# Patient Record
Sex: Male | Born: 1950 | Race: White | Hispanic: No | Smoking: Former smoker
Health system: Southern US, Community
[De-identification: ages and names within clinical notes are randomized; demographics above are authoritative.]

## PROBLEM LIST (undated history)

## (undated) DIAGNOSIS — K219 Gastro-esophageal reflux disease without esophagitis: Secondary | ICD-10-CM

## (undated) DIAGNOSIS — I10 Essential (primary) hypertension: Secondary | ICD-10-CM

## (undated) DIAGNOSIS — M1990 Unspecified osteoarthritis, unspecified site: Secondary | ICD-10-CM

## (undated) DIAGNOSIS — E7800 Pure hypercholesterolemia, unspecified: Secondary | ICD-10-CM

## (undated) HISTORY — DX: Pure hypercholesterolemia, unspecified: E78.00

## (undated) HISTORY — PX: JOINT REPLACEMENT: SHX530

---

## 1986-05-15 HISTORY — PX: CYSTECTOMY: SUR359

## 2001-06-25 ENCOUNTER — Encounter: Payer: Self-pay | Admitting: Internal Medicine

## 2001-06-25 ENCOUNTER — Ambulatory Visit (HOSPITAL_COMMUNITY): Admission: RE | Admit: 2001-06-25 | Discharge: 2001-06-25 | Payer: Self-pay | Admitting: Internal Medicine

## 2001-08-21 ENCOUNTER — Encounter: Payer: Self-pay | Admitting: Emergency Medicine

## 2001-08-21 ENCOUNTER — Emergency Department (HOSPITAL_COMMUNITY): Admission: EM | Admit: 2001-08-21 | Discharge: 2001-08-21 | Payer: Self-pay | Admitting: Emergency Medicine

## 2006-08-08 ENCOUNTER — Ambulatory Visit (HOSPITAL_COMMUNITY): Admission: RE | Admit: 2006-08-08 | Discharge: 2006-08-08 | Payer: Self-pay | Admitting: Internal Medicine

## 2009-06-23 ENCOUNTER — Ambulatory Visit (HOSPITAL_COMMUNITY): Admission: RE | Admit: 2009-06-23 | Discharge: 2009-06-23 | Payer: Self-pay | Admitting: Internal Medicine

## 2009-06-23 ENCOUNTER — Encounter: Payer: Self-pay | Admitting: Orthopedic Surgery

## 2009-07-14 ENCOUNTER — Ambulatory Visit: Payer: Self-pay | Admitting: Orthopedic Surgery

## 2009-07-14 DIAGNOSIS — M1610 Unilateral primary osteoarthritis, unspecified hip: Secondary | ICD-10-CM | POA: Insufficient documentation

## 2009-07-14 DIAGNOSIS — M169 Osteoarthritis of hip, unspecified: Secondary | ICD-10-CM

## 2009-07-19 ENCOUNTER — Encounter: Payer: Self-pay | Admitting: Orthopedic Surgery

## 2010-06-04 ENCOUNTER — Encounter: Payer: Self-pay | Admitting: Internal Medicine

## 2010-06-14 NOTE — Letter (Signed)
Summary: History form  History form   Imported By: Jacklynn Ganong 07/20/2009 09:35:04  _____________________________________________________________________  External Attachment:    Type:   Image     Comment:   External Document

## 2010-06-14 NOTE — Assessment & Plan Note (Signed)
Summary: OA RT HIP/HAD XRAYS [facility]/REF Z.HALL/[facility]/CAF   Vital Signs:  Patient profile:   60 year old male Weight:      206 pounds Pulse rate:   62 / minute Resp:     16 per minute  Vitals Entered By: Fuller Canada MD (July 14, 2009 11:13 AM)  Visit Type:  Initial Consult Referring Provider:  Dr. Dwana Melena Primary Provider:  Dr. Dwana Melena  CC:  right hip pain.  History of Present Illness: This is a 60 year old male who presents with RIGHT hip pain in his RIGHT hip and RIGHT iliac crest and anterior thigh associated with stiffness of his hip.  He denies any numbness. he describes a moderate grade 6/10 throbbing intermittent pain which came on gradually.  It is worse with walking.  He feels a catching sensation.  He has been treated with Tylenol, Motrin, tramadol and Celebrex.  Celebrex seemed to work the best but it wasn't generic so when he ran out of it it was not restarted.  He did get some relief from tramadol.  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  Meds: Lisinopril/HCTZ, ASA, Tramadol HCL.    Allergies (verified): No Known Drug Allergies  Past History:  Past Medical History: htn  Past Surgical History: na  Family History: FH of Cancer:  Family History Coronary Heart Disease male < 26  Social History: Patient is married.  unemployed 1 1/2 ppd cigs no alcohol use 1/2 gallon of caffeine per day  Review of Systems Constitutional:  Denies weight loss, weight gain, fever, chills, and fatigue. Cardiovascular:  Denies chest pain, palpitations, fainting, and murmurs. Respiratory:  Complains of short of breath; denies wheezing, couch, tightness, pain on inspiration, and snoring . Gastrointestinal:  Denies heartburn, nausea, vomiting, diarrhea, constipation, and blood in your stools. Genitourinary:  Denies frequency, urgency, difficulty  urinating, painful urination, flank pain, and bleeding in urine. Neurologic:  Complains of unsteady gait; denies numbness, tingling, dizziness, tremors, and seizure. Musculoskeletal:  Complains of joint pain, instability, stiffness, and muscle pain; denies swelling, redness, and heat. Endocrine:  Denies excessive thirst, exessive urination, and heat or cold intolerance. Psychiatric:  Denies nervousness, depression, anxiety, and hallucinations. Skin:  Denies changes in the skin, poor healing, rash, itching, and redness. HEENT:  Denies blurred or double vision, eye pain, redness, and watering. Immunology:  Denies seasonal allergies, sinus problems, and allergic to bee stings. Hemoatologic:  Complains of easy bleeding; denies brusing.  Physical Exam  Additional Exam:  GEN: well developed, well nourished, normal grooming and hygiene, no deformity and normal body habitus.   CDV: pulses are normal, no edema, no erythema. no tenderness  Lymph: normal lymph nodes   Skin: no rashes, skin lesions or open sores   NEURO: normal coordination, reflexes, sensation.   Psyche: awake, alert and oriented. Mood normal   Gait: he does have a very subtle LEFT  LEFT lower extremity inspection was normal, range of motion was full except for internal rotation of the hip which was approximately 30 no pain  Hip stable muscle strength and tone normal  RIGHT hip he essentially has 10 of internal rotation only 95 of flexion and has pain at that level he has no tenderness over the greater trochanter anterior hip joint his hip is stable his muscle strength and tone are normal  It is a negative straight  leg raise in both legs    The upper extremities have normal appearance, ROM, strength and stability.    Impression & Recommendations:  Problem # 1:  HIP, ARTHRITIS, DEGEN./OSTEO (ICD-715.95) Assessment New  the hip films show osteoarthritis of the RIGHT hip joint with decreased femoral offset noted on  the LEFT side as well there is no degenerative change on the LEFT yet.  His RIGHT hip is deathly arthritic and will require total hip arthroplasty.  The patient will discuss this and consider this with family and job.  He will need repeat AP lateral pelvic x-rays prior to surgery to template as his films are from the hospital.  Orders: New Patient Level IV ([address])  Patient Instructions: 1)  Call us when ready for THA

## 2010-06-14 NOTE — Letter (Signed)
Summary: Generic Letter  [facility]  [address] [place] [address]  [place], [place] [address]   Phone: [phone]  Fax: [phone]    07/19/2009  [facility] [address] [place], [place]  [address]  Dear Mr. Bierlein,   This letter is to indicate that you were seen in our office for an evaluation of your hip regarding a total hip arthroplasty.  The estimated post operative course/out of work time is approximately  12 weeks.      Sincerely,    Terrance Mass, MD

## 2011-07-04 ENCOUNTER — Telehealth: Payer: Self-pay

## 2011-07-04 ENCOUNTER — Other Ambulatory Visit: Payer: Self-pay

## 2011-07-04 DIAGNOSIS — Z139 Encounter for screening, unspecified: Secondary | ICD-10-CM

## 2011-07-04 NOTE — Telephone Encounter (Signed)
Faxed note to Dr. Dwana Melena that appt has been scheduled for 07/19/2011 with Dr. Jena Gauss for colonoscopy.

## 2011-07-04 NOTE — Telephone Encounter (Deleted)
Gastroenterology Pre-Procedure Form    Request Date: 07/19/2011         Requesting Physician: Dr. Dwana Melena     PATIENT INFORMATION:  Wyatt Wilson is a 61 y.o., male (DOB=[birth year]).  PROCEDURE: Procedure(s) requested: colonoscopy Procedure Reason: screening for colon cancer  PATIENT REVIEW QUESTIONS: The patient reports the following:   1. Diabetes Melitis: no 2. Joint replacements in the past 12 months: no 3. Major health problems in the past 3 months: no 4. Has an artificial valve or MVP:no 5. Has been advised in past to take antibiotics in advance of a procedure like teeth cleaning: no}    MEDICATIONS & ALLERGIES:    Patient reports the following regarding taking any blood thinners:   Plavix? no Aspirin?yes  Coumadin?  no  Patient confirms/reports the following medications:  Current Outpatient Prescriptions  Medication Sig Dispense Refill  . amLODipine (NORVASC) 5 MG tablet Take 5 mg by mouth daily. Takes at bedtime      . captopril-hydrochlorothiazide (CAPOZIDE) 25-25 MG per tablet Take 1 tablet by mouth daily.      Marland Kitchen lisinopril (PRINIVIL,ZESTRIL) 20 MG tablet Take 20 mg by mouth daily.      . Naproxen Sodium (ALEVE) 220 MG CAPS Take by mouth. Pt takes one tablet daily for arthritis in hip        Patient confirms/reports the following allergies:  No Known Allergies  Patient is appropriate to schedule for requested procedure(s): {Yes/No-Ex:120004}  AUTHORIZATION INFORMATION Primary Insurance: ***,  ID #: ***,  Group #: *** Pre-Cert / Auth required: {LKG/MW-NU:272536} Pre-Cert / Auth #: ***  Secondary Insurance: ***,  ID #: ***,  Group #: *** Pre-Cert / Auth required: {UYQ/IH-KV:425956} Pre-Cert / Auth #: ***  No orders of the defined types were placed in this encounter.    SCHEDULE INFORMATION: Procedure has been scheduled as follows:  Date: ***, Time: ***  Location: ***  This Gastroenterology Pre-Precedure Form is being routed to the following  provider(s) for review: {RGA PROVIDERS:21542}

## 2011-07-04 NOTE — Telephone Encounter (Signed)
Rx and instructions mailed to pt.  

## 2011-07-04 NOTE — Telephone Encounter (Signed)
Gastroenterology Pre-Procedure Form    Request Date: 07/04/2011       Requesting Physician: Dr. Dwana Melena     PATIENT INFORMATION:  Wyatt Wilson is a 61 y.o., male (DOB=09/30/1950).  PROCEDURE: Procedure(s) requested: colonoscopy Procedure Reason: screening for colon cancer  PATIENT REVIEW QUESTIONS: The patient reports the following:   1. Diabetes Melitis: no 2. Joint replacements in the past 12 months: no 3. Major health problems in the past 3 months: no 4. Has an artificial valve or MVP:no 5. Has been advised in past to take antibiotics in advance of a procedure like teeth cleaning: no}    MEDICATIONS & ALLERGIES:    Patient reports the following regarding taking any blood thinners:   Plavix? no Aspirin?yes  Coumadin?  no  Patient confirms/reports the following medications:  Current Outpatient Prescriptions  Medication Sig Dispense Refill  . amLODipine (NORVASC) 5 MG tablet Take 5 mg by mouth daily. Takes at bedtime      . aspirin 81 MG tablet Take 81 mg by mouth daily.      . captopril-hydrochlorothiazide (CAPOZIDE) 25-25 MG per tablet Take 1 tablet by mouth daily.      Marland Kitchen lisinopril (PRINIVIL,ZESTRIL) 20 MG tablet Take 20 mg by mouth daily.      . Naproxen Sodium (ALEVE) 220 MG CAPS Take by mouth. Pt takes one tablet daily for arthritis in hip        Patient confirms/reports the following allergies:  No Known Allergies  Patient is appropriate to schedule for requested procedure(s): yes  AUTHORIZATION INFORMATION Primary Insurance:   ID #:   Group #:  Pre-Cert / Auth required:  Pre-Cert / Auth #:   Secondary Insurance:   ID #:   Group #:  Pre-Cert / Auth required:  Pre-Cert / Auth #:   No orders of the defined types were placed in this encounter.    SCHEDULE INFORMATION: Procedure has been scheduled as follows:  Date: 07/19/2011            Time: 9:00 AM  Location: [facility] Short Stay  This Gastroenterology Pre-Precedure Form is being routed to  the following provider(s) for review: R. Roetta Sessions, MD

## 2011-07-04 NOTE — Telephone Encounter (Signed)
OK as is.

## 2011-07-17 ENCOUNTER — Encounter (HOSPITAL_COMMUNITY): Payer: Self-pay | Admitting: Pharmacy Technician

## 2011-07-18 MED ORDER — SODIUM CHLORIDE 0.45 % IV SOLN: Freq: Once | INTRAVENOUS | Status: DC

## 2011-07-19 ENCOUNTER — Encounter (HOSPITAL_COMMUNITY): Payer: Self-pay | Admitting: *Deleted

## 2011-07-19 ENCOUNTER — Ambulatory Visit (HOSPITAL_COMMUNITY)
Admission: RE | Admit: 2011-07-19 | Discharge: 2011-07-19 | Disposition: A | Discharge status: Home Health | Payer: 59 | Source: Ambulatory Visit | Attending: Internal Medicine | Admitting: Internal Medicine

## 2011-07-19 ENCOUNTER — Encounter (HOSPITAL_COMMUNITY)
Admission: RE | Disposition: A | Discharge status: Home Health | Payer: Self-pay | Source: Ambulatory Visit | Attending: Internal Medicine

## 2011-07-19 DIAGNOSIS — K5730 Diverticulosis of large intestine without perforation or abscess without bleeding: Secondary | ICD-10-CM | POA: Insufficient documentation

## 2011-07-19 DIAGNOSIS — Z79899 Other long term (current) drug therapy: Secondary | ICD-10-CM | POA: Insufficient documentation

## 2011-07-19 DIAGNOSIS — D126 Benign neoplasm of colon, unspecified: Secondary | ICD-10-CM | POA: Insufficient documentation

## 2011-07-19 DIAGNOSIS — D129 Benign neoplasm of anus and anal canal: Secondary | ICD-10-CM

## 2011-07-19 DIAGNOSIS — Z139 Encounter for screening, unspecified: Secondary | ICD-10-CM

## 2011-07-19 DIAGNOSIS — Z1211 Encounter for screening for malignant neoplasm of colon: Secondary | ICD-10-CM

## 2011-07-19 DIAGNOSIS — I10 Essential (primary) hypertension: Secondary | ICD-10-CM | POA: Insufficient documentation

## 2011-07-19 DIAGNOSIS — D128 Benign neoplasm of rectum: Secondary | ICD-10-CM

## 2011-07-19 DIAGNOSIS — Z7982 Long term (current) use of aspirin: Secondary | ICD-10-CM | POA: Insufficient documentation

## 2011-07-19 HISTORY — DX: Essential (primary) hypertension: I10

## 2011-07-19 HISTORY — PX: COLONOSCOPY: SHX5424

## 2011-07-19 HISTORY — DX: Unspecified osteoarthritis, unspecified site: M19.90

## 2011-07-19 SURGERY — COLONOSCOPY
Anesthesia: Moderate Sedation

## 2011-07-19 MED ORDER — MEPERIDINE HCL 100 MG/ML IJ SOLN
INTRAMUSCULAR | Status: DC | PRN
  Administered 2011-07-19 (×2): 50 mg via INTRAVENOUS

## 2011-07-19 MED ORDER — MIDAZOLAM HCL 5 MG/5ML IJ SOLN
INTRAMUSCULAR | Status: DC | PRN
  Administered 2011-07-19 (×2): 2 mg via INTRAVENOUS
  Administered 2011-07-19: 1 mg via INTRAVENOUS

## 2011-07-19 MED ORDER — MEPERIDINE HCL 100 MG/ML IJ SOLN
INTRAMUSCULAR | Status: AC
  Filled 2011-07-19: qty 2

## 2011-07-19 MED ORDER — MIDAZOLAM HCL 5 MG/5ML IJ SOLN
INTRAMUSCULAR | Status: AC
  Filled 2011-07-19: qty 10

## 2011-07-19 MED ORDER — STERILE WATER FOR IRRIGATION IR SOLN
Status: DC | PRN
  Administered 2011-07-19: 10:00:00

## 2011-07-19 NOTE — Discharge Instructions (Signed)
Colonoscopy Discharge Instructions  Read the instructions outlined below and refer to this sheet in the next few weeks. These discharge instructions provide you with general information on caring for yourself after you leave the hospital. Your doctor may also give you specific instructions. While your treatment has been planned according to the most current medical practices available, unavoidable complications occasionally occur. If you have any problems or questions after discharge, call Dr. Jena Gauss at [phone]. ACTIVITY  You may resume your regular activity, but move at a slower pace for the next 24 hours.   Take frequent rest periods for the next 24 hours.   Walking will help get rid of the air and reduce the bloated feeling in your belly (abdomen).   No driving for 24 hours (because of the medicine (anesthesia) used during the test).    Do not sign any important legal documents or operate any machinery for 24 hours (because of the anesthesia used during the test).  NUTRITION  Drink plenty of fluids.   You may resume your normal diet as instructed by your doctor.   Begin with a light meal and progress to your normal diet. Heavy or fried foods are harder to digest and may make you feel sick to your stomach (nauseated).   Avoid alcoholic beverages for 24 hours or as instructed.  MEDICATIONS  You may resume your normal medications unless your doctor tells you otherwise.  WHAT YOU CAN EXPECT TODAY  Some feelings of bloating in the abdomen.   Passage of more gas than usual.   Spotting of blood in your stool or on the toilet paper.  IF YOU HAD POLYPS REMOVED DURING THE COLONOSCOPY:  No aspirin products for 7 days or as instructed.   No alcohol for 7 days or as instructed.   Eat a soft diet for the next 24 hours.  FINDING OUT THE RESULTS OF YOUR TEST Not all test results are available during your visit. If your test results are not back during the visit, make an appointment  with your caregiver to find out the results. Do not assume everything is normal if you have not heard from your caregiver or the medical facility. It is important for you to follow up on all of your test results.  SEEK IMMEDIATE MEDICAL ATTENTION IF:  You have more than a spotting of blood in your stool.   Your belly is swollen (abdominal distention).   You are nauseated or vomiting.   You have a temperature over 101.   You have abdominal pain or discomfort that is severe or gets worse throughout the day.    Polyp and diverticulosis information provided Colon Polyps A polyp is extra tissue that grows inside your body. Colon polyps grow in the large intestine. The large intestine, also called the colon, is part of your digestive system. It is a long, hollow tube at the end of your digestive tract where your body makes and stores stool. Most polyps are not dangerous. They are benign. This means they are not cancerous. But over time, some types of polyps can turn into cancer. Polyps that are smaller than a pea are usually not harmful. But larger polyps could someday become or may already be cancerous. To be safe, doctors remove all polyps and test them.  WHO GETS POLYPS? Anyone can get polyps, but certain people are more likely than others. You may have a greater chance of getting polyps if:  You are over 50.   You have had  polyps before.   Someone in your family has had polyps.   Someone in your family has had cancer of the large intestine.   Find out if someone in your family has had polyps. You may also be more likely to get polyps if you:   Eat a lot of fatty foods.   Smoke.   Drink alcohol.   Do not exercise.   Eat too much.  SYMPTOMS  Most small polyps do not cause symptoms. People often do not know they have one until their caregiver finds it during a regular checkup or while testing them for something else. Some people do have symptoms like these:  Bleeding from the  anus. You might notice blood on your underwear or on toilet paper after you have had a bowel movement.   Constipation or diarrhea that lasts more than a week.   Blood in the stool. Blood can make stool look black or it can show up as red streaks in the stool.  If you have any of these symptoms, see your caregiver. HOW DOES THE DOCTOR TEST FOR POLYPS? The doctor can use four tests to check for polyps:  Digital rectal exam. The caregiver wears gloves and checks your rectum (the last part of the large intestine) to see if it feels normal. This test would find polyps only in the rectum. Your caregiver may need to do one of the other tests listed below to find polyps higher up in the intestine.   Barium enema. The caregiver puts a liquid called barium into your rectum before taking x-rays of your large intestine. Barium makes your intestine look white in the pictures. Polyps are dark, so they are easy to see.   Sigmoidoscopy. With this test, the caregiver can see inside your large intestine. A thin flexible tube is placed into your rectum. The device is called a sigmoidoscope, which has a light and a tiny video camera in it. The caregiver uses the sigmoidoscope to look at the last third of your large intestine.   Colonoscopy. This test is like sigmoidoscopy, but the caregiver looks at all of the large intestine. It usually requires sedation. This is the most common method for finding and removing polyps.  TREATMENT   The caregiver will remove the polyp during sigmoidoscopy or colonoscopy. The polyp is then tested for cancer.   If you have had polyps, your caregiver may want you to get tested regularly in the future.  PREVENTION  There is not one sure way to prevent polyps. You might be able to lower your risk of getting them if you:  Eat more fruits and vegetables and less fatty food.   Do not smoke.   Avoid alcohol.   Exercise every day.   Lose weight if you are overweight.   Eating  more calcium and folate can also lower your risk of getting polyps. Some foods that are rich in calcium are milk, cheese, and broccoli. Some foods that are rich in folate are chickpeas, kidney beans, and spinach.   Aspirin might help prevent polyps. Studies are under way.  Document Released: 01/26/2004 Document Revised: 04/20/2011 Document Reviewed: 07/03/2007 [facility] Patient Information 2012 [place], [place].  Diverticulosis Diverticulosis is a common condition that develops when small pouches (diverticula) form in the wall of the colon. The risk of diverticulosis increases with age. It happens more often in people who eat a low-fiber diet. Most individuals with diverticulosis have no symptoms. Those individuals with symptoms usually experience abdominal pain, constipation,  or loose stools (diarrhea). HOME CARE INSTRUCTIONS   Increase the amount of fiber in your diet as directed by your caregiver or dietician. This may reduce symptoms of diverticulosis.   Your caregiver may recommend taking a dietary fiber supplement.   Drink at least 6 to 8 glasses of water each day to prevent constipation.   Try not to strain when you have a bowel movement.   Your caregiver may recommend avoiding nuts and seeds to prevent complications, although this is still an uncertain benefit.   Only take over-the-counter or prescription medicines for pain, discomfort, or fever as directed by your caregiver.  FOODS WITH HIGH FIBER CONTENT INCLUDE:  Fruits. Apple, peach, pear, tangerine, raisins, prunes.   Vegetables. Brussels sprouts, asparagus, broccoli, cabbage, carrot, cauliflower, romaine lettuce, spinach, summer squash, tomato, winter squash, zucchini.   Starchy Vegetables. Baked beans, kidney beans, lima beans, split peas, lentils, potatoes (with skin).   Grains. Whole wheat bread, brown rice, bran flake cereal, plain oatmeal, white rice, shredded wheat, bran muffins.  SEEK IMMEDIATE MEDICAL CARE IF:    You develop increasing pain or severe bloating.   You have an oral temperature above 102 F (38.9 C), not controlled by medicine.   You develop vomiting or bowel movements that are bloody or black.  Document Released: 01/27/2004 Document Revised: 04/20/2011 Document Reviewed: 09/29/2009 [facility] Patient Information 2012 [place], [place].

## 2011-07-19 NOTE — Op Note (Signed)
[facility] [address] [place], [place]  [address]  COLONOSCOPY PROCEDURE REPORT  PATIENT:  Wyatt Wilson, Wyatt Wilson  MR#:  [id number] BIRTHDATE:  05-07-1951, 60 yrs. old  GENDER:  male ENDOSCOPIST:  R. Roetta Sessions, MD FACP [facility] REF. BY:  Catalina Pizza, M.D. PROCEDURE DATE:  07/19/2011 PROCEDURE:  Colonoscopy with polyp ablation snare polypectomy and biopsy  INDICATIONS:  First-ever average risk screening colonoscopy  INFORMED CONSENT:  The risks, benefits, alternatives and imponderables including but not limited to bleeding, perforation as well as the possibility of a missed lesion have been reviewed. The potential for biopsy, lesion removal, etc. have also been discussed.  Questions have been answered.  All parties agreeable. Please see the history and physical in the medical record for more information.  MEDICATIONS:  Versed 5 mg IV and Demerol 100 mg IV in divided doses  DESCRIPTION OF PROCEDURE:  After a digital rectal exam was performed, the EC-[id number] ([id number]) colonoscope was advanced from the anus through the rectum and colon to the area of the cecum, ileocecal valve and appendiceal orifice.  The cecum was deeply intubated.  These structures were well-seen and photographed for the record.  From the level of the cecum and ileocecal valve, the scope was slowly and cautiously withdrawn.  The mucosal surfaces were carefully surveyed utilizing scope tip deflection to facilitate fold flattening as needed.  The scope was pulled down into the rectum where a thorough examination including retroflexion was performed. <<PROCEDUREIMAGES>>  FINDINGS: Adequate preparation. Multiple diminutive rectal and rectosigmoid polyps. Sigmoid diverticula. Multiple ascending and hepatic flexure polyps - largest proximally 7 mm in dimensions. Otherwise, the colonic mucosa appeared normal.  THERAPEUTIC / DIAGNOSTIC MANEUVERS PERFORMED:  Multiple hot and cold snare polypectomies performed.  Rectosigmoid and rectal biopsy, snared and ablated with the tip of a hot snare cautery unit.  COMPLICATIONS:  None  CECAL WITHDRAWAL TIME:  22 minutes  IMPRESSION:   Multiple colorectal polyps-treated as outlined above. Sigmoid diverticulosis  RECOMMENDATIONS:    See discharge instructions. Followup pathology.  ______________________________ R. Roetta Sessions, MD Caleen Essex  CC:  Catalina Pizza, M.D.  n. eSIGNED:   R. Roetta Sessions at 07/19/2011 11:13 AM  Donneta Romberg, [phone]

## 2011-07-19 NOTE — H&P (Signed)
  Primary Care Physician:  Dwana Melena, MD, MD Primary Gastroenterologist:  Dr. Jena Gauss  Pre-Procedure History & Physical: HPI:  Wyatt Wilson is a 61 y.o. male is here for a screening colonoscopy. First-ever examination. No bowel symptoms. Family history positive colon cancer the  Past Medical History  Diagnosis Date  . Hypertension   . Arthritis     Past Surgical History  Procedure Date  . Cystectomy 1988    Cyst removed from back    Prior to Admission medications   Medication Sig Start Date End Date Taking? Authorizing Provider  amLODipine (NORVASC) 5 MG tablet Take 5 mg by mouth at bedtime.    Yes Historical Provider, MD  aspirin EC 81 MG tablet Take 81 mg by mouth daily.   Yes Historical Provider, MD  lisinopril-hydrochlorothiazide (PRINZIDE,ZESTORETIC) 20-25 MG per tablet Take 1 tablet by mouth daily.   Yes Historical Provider, MD  Naproxen Sodium (ALEVE) 220 MG CAPS Take 1 capsule by mouth daily. for arthritis in hip   Yes Historical Provider, MD    Allergies as of 07/04/2011  . (No Known Allergies)    Family History  Problem Relation Age of Onset  . Alzheimer's disease Father     History   Social History  . Marital Status: Married    Spouse Name: N/A    Number of Children: N/A  . Years of Education: N/A   Occupational History  . Not on file.   Social History Main Topics  . Smoking status: Current Everyday Smoker -- 1.5 packs/day for 30 years    Types: Cigarettes  . Smokeless tobacco: Not on file  . Alcohol Use: Yes     Once a month  . Drug Use: No  . Sexually Active: Yes   Other Topics Concern  . Not on file   Social History Narrative  . No narrative on file    Review of Systems: See HPI, otherwise negative ROS  Physical Exam: BP 122/79  Pulse 78  Temp(Src) 98.1 F (36.7 C) (Oral)  Resp 18 General:   Alert,  Well-developed, well-nourished, pleasant and cooperative in NAD Head:  Normocephalic and atraumatic. Eyes:  Sclera clear, no  icterus.   Conjunctiva pink. Ears:  Normal auditory acuity. Nose:  No deformity, discharge,  or lesions. Mouth:  No deformity or lesions, dentition normal. Neck:  Supple; no masses or thyromegaly. Lungs:  Clear throughout to auscultation.   No wheezes, crackles, or rhonchi. No acute distress. Heart:  Regular rate and rhythm; no murmurs, clicks, rubs,  or gallops. Abdomen:  Soft, nontender and nondistended. No masses, hepatosplenomegaly or hernias noted. Normal bowel sounds, without guarding, and without rebound.   Msk:  Symmetrical without gross deformities. Normal posture. Pulses:  Normal pulses noted. Extremities:  Without clubbing or edema. Neurologic:  Alert and  oriented x4;  grossly normal neurologically. Skin:  Intact without significant lesions or rashes. Cervical Nodes:  No significant cervical adenopathy. Psych:  Alert and cooperative. Normal mood and affect.  Impression/Plan: Wyatt Wilson is now here to undergo a screening colonoscopy. First-ever average risk screening examination.  Risks, benefits, limitations, imponderables and alternatives regarding colonoscopy have reviewed with the patient. Questions have been answered. All parties agreeable. A hole in and a port a new to a

## 2011-07-20 ENCOUNTER — Encounter: Payer: Self-pay | Admitting: Internal Medicine

## 2011-07-24 ENCOUNTER — Encounter: Payer: Self-pay | Admitting: Internal Medicine

## 2011-07-24 ENCOUNTER — Encounter (HOSPITAL_COMMUNITY): Payer: Self-pay | Admitting: Internal Medicine

## 2014-07-15 ENCOUNTER — Encounter: Payer: Self-pay | Admitting: Internal Medicine

## 2014-07-17 ENCOUNTER — Other Ambulatory Visit (HOSPITAL_COMMUNITY): Payer: Self-pay | Admitting: Internal Medicine

## 2014-07-17 DIAGNOSIS — R1030 Lower abdominal pain, unspecified: Secondary | ICD-10-CM

## 2014-07-22 ENCOUNTER — Other Ambulatory Visit (HOSPITAL_COMMUNITY): Payer: Self-pay

## 2014-07-23 ENCOUNTER — Ambulatory Visit (HOSPITAL_COMMUNITY)
Admission: RE | Admit: 2014-07-23 | Discharge: 2014-07-23 | Disposition: A | Discharge status: Home / Self Care | Payer: 59 | Source: Ambulatory Visit | Attending: Internal Medicine | Admitting: Internal Medicine

## 2014-07-23 DIAGNOSIS — R1030 Lower abdominal pain, unspecified: Secondary | ICD-10-CM | POA: Diagnosis present

## 2015-07-13 ENCOUNTER — Other Ambulatory Visit (HOSPITAL_COMMUNITY): Payer: Self-pay | Admitting: Internal Medicine

## 2015-07-13 DIAGNOSIS — Z87891 Personal history of nicotine dependence: Secondary | ICD-10-CM

## 2015-07-19 ENCOUNTER — Ambulatory Visit (HOSPITAL_COMMUNITY)
Admission: RE | Admit: 2015-07-19 | Discharge: 2015-07-19 | Disposition: A | Discharge status: Home / Self Care | Payer: 59 | Source: Ambulatory Visit | Attending: Internal Medicine | Admitting: Internal Medicine

## 2015-07-19 DIAGNOSIS — Z87891 Personal history of nicotine dependence: Secondary | ICD-10-CM | POA: Diagnosis not present

## 2015-09-01 ENCOUNTER — Other Ambulatory Visit (HOSPITAL_COMMUNITY): Payer: Self-pay | Admitting: Internal Medicine

## 2015-09-01 ENCOUNTER — Ambulatory Visit (HOSPITAL_COMMUNITY)
Admission: RE | Admit: 2015-09-01 | Discharge: 2015-09-01 | Disposition: A | Discharge status: Home / Self Care | Payer: 59 | Source: Ambulatory Visit | Attending: Internal Medicine | Admitting: Internal Medicine

## 2015-09-01 DIAGNOSIS — M25511 Pain in right shoulder: Secondary | ICD-10-CM

## 2015-09-10 ENCOUNTER — Ambulatory Visit (INDEPENDENT_AMBULATORY_CARE_PROVIDER_SITE_OTHER): Payer: 59 | Admitting: Orthopedic Surgery

## 2015-09-10 ENCOUNTER — Encounter: Payer: Self-pay | Admitting: Orthopedic Surgery

## 2015-09-10 VITALS — BP 143/84 | Ht 70.0 in | Wt 209.0 lb

## 2015-09-10 DIAGNOSIS — M75101 Unspecified rotator cuff tear or rupture of right shoulder, not specified as traumatic: Secondary | ICD-10-CM | POA: Diagnosis not present

## 2015-09-10 NOTE — Patient Instructions (Addendum)
Impingement Syndrome, Rotator Cuff, Bursitis With Rehab °Impingement syndrome is a condition that involves inflammation of the tendons of the rotator cuff and the subacromial bursa, that causes pain in the shoulder. The rotator cuff consists of four tendons and muscles that control much of the shoulder and upper arm function. The subacromial bursa is a fluid filled sac that helps reduce friction between the rotator cuff and one of the bones of the shoulder (acromion). Impingement syndrome is usually an overuse injury that causes swelling of the bursa (bursitis), swelling of the tendon (tendonitis), and/or a tear of the tendon (strain). Strains are classified into three categories. Grade 1 strains cause pain, but the tendon is not lengthened. Grade 2 strains include a lengthened ligament, due to the ligament being stretched or partially ruptured. With grade 2 strains there is still function, although the function may be decreased. Grade 3 strains include a complete tear of the tendon or muscle, and function is usually impaired. °SYMPTOMS  °· Pain around the shoulder, often at the outer portion of the upper arm. °· Pain that gets worse with shoulder function, especially when reaching overhead or lifting. °· Sometimes, aching when not using the arm. °· Pain that wakes you up at night. °· Sometimes, tenderness, swelling, warmth, or redness over the affected area. °· Loss of strength. °· Limited motion of the shoulder, especially reaching behind the back (to the back pocket or to unhook bra) or across your body. °· Crackling sound (crepitation) when moving the arm. °· Biceps tendon pain and inflammation (in the front of the shoulder). Worse when bending the elbow or lifting. °CAUSES  °Impingement syndrome is often an overuse injury, in which chronic (repetitive) motions cause the tendons or bursa to become inflamed. A strain occurs when a force is paced on the tendon or muscle that is greater than it can withstand.  Common mechanisms of injury include: °Stress from sudden increase in duration, frequency, or intensity of training. °· Direct hit (trauma) to the shoulder. °· Aging, erosion of the tendon with normal use. °· Bony bump on shoulder (acromial spur). °RISK INCREASES WITH: °· Contact sports (football, wrestling, boxing). °· Throwing sports (baseball, tennis, volleyball). °· Weightlifting and bodybuilding. °· Heavy labor. °· Previous injury to the rotator cuff, including impingement. °· Poor shoulder strength and flexibility. °· Failure to warm up properly before activity. °· Inadequate protective equipment. °· Old age. °· Bony bump on shoulder (acromial spur). °PREVENTION  °· Warm up and stretch properly before activity. °· Allow for adequate recovery between workouts. °· Maintain physical fitness: °¨ Strength, flexibility, and endurance. °¨ Cardiovascular fitness. °· Learn and use proper exercise technique. °PROGNOSIS  °If treated properly, impingement syndrome usually goes away within 6 weeks. Sometimes surgery is required.  °RELATED COMPLICATIONS  °· Longer healing time if not properly treated, or if not given enough time to heal. °· Recurring symptoms, that result in a chronic condition. °· Shoulder stiffness, frozen shoulder, or loss of motion. °· Rotator cuff tendon tear. °· Recurring symptoms, especially if activity is resumed too soon, with overuse, with a direct blow, or when using poor technique. °TREATMENT  °Treatment first involves the use of ice and medicine, to reduce pain and inflammation. The use of strengthening and stretching exercises may help reduce pain with activity. These exercises may be performed at home or with a therapist. If non-surgical treatment is unsuccessful after more than 6 months, surgery may be advised. After surgery and rehabilitation, activity is usually possible in 3 months.  °  MEDICATION °· If pain medicine is needed, nonsteroidal anti-inflammatory medicines (aspirin and  ibuprofen), or other minor pain relievers (acetaminophen), are often advised. °· Do not take pain medicine for 7 days before surgery. °· Prescription pain relievers may be given, if your caregiver thinks they are needed. Use only as directed and only as much as you need. °· Corticosteroid injections may be given by your caregiver. These injections should be reserved for the most serious cases, because they may only be given a certain number of times. °HEAT AND COLD °· Cold treatment (icing) should be applied for 10 to 15 minutes every 2 to 3 hours for inflammation and pain, and immediately after activity that aggravates your symptoms. Use ice packs or an ice massage. °· Heat treatment may be used before performing stretching and strengthening activities prescribed by your caregiver, physical therapist, or athletic trainer. Use a heat pack or a warm water soak. °SEEK MEDICAL CARE IF:  °· Symptoms get worse or do not improve in 4 to 6 weeks, despite treatment. °· New, unexplained symptoms develop. (Drugs used in treatment may produce side effects.) ° °

## 2015-09-10 NOTE — Progress Notes (Signed)
Patient ID: Wyatt Wilson, male   DOB: 1950/07/19, 65 y.o.   MRN: [id number]  Chief Complaint  Patient presents with  . Shoulder Pain    Right shoulder pain    HPI BYRON SICKEL is a 65 y.o. male.  Presents for evaluation of his right shoulder with 2 months of pain  His pain began after he was working under his mother's house holding a drill with his right hand. About a month later he started having pain  He describes right shoulder pain Catching sensation 4 out of 10 sharp burning pain After activity Worse with using his right arm HPI  Review of Systems Review of Systems Normal neuro NO NUMBNESS  Denies fever   Past Medical History  Diagnosis Date  . Hypertension   . Arthritis     Past Surgical History  Procedure Laterality Date  . Cystectomy  1988    Cyst removed from back  . Colonoscopy  07/19/2011    Procedure: COLONOSCOPY;  Surgeon: Daneil Dolin, MD;  Location: [facility];  Service: Endoscopy;  Laterality: N/A;  9:00 AM    Family History  Problem Relation Age of Onset  . Alzheimer's disease Father     Social History Social History  Substance Use Topics  . Smoking status: Current Every Day Smoker -- 1.50 packs/day for 30 years    Types: Cigarettes  . Smokeless tobacco: None  . Alcohol Use: Yes     Comment: Once a month    No Known Allergies  Current Outpatient Prescriptions  Medication Sig Dispense Refill  . amLODipine (NORVASC) 5 MG tablet Take 5 mg by mouth at bedtime.     Marland Kitchen aspirin EC 81 MG tablet Take 81 mg by mouth daily.    . diclofenac (VOLTAREN) 75 MG EC tablet Take 75 mg by mouth 2 (two) times daily.    Marland Kitchen lisinopril-hydrochlorothiazide (PRINZIDE,ZESTORETIC) 20-25 MG per tablet Take 1 tablet by mouth daily.    . pravastatin (PRAVACHOL) 20 MG tablet Take 20 mg by mouth daily.     No current facility-administered medications for this visit.       Physical Exam Blood pressure 143/84, height 5\' 10"  (1.778 m), weight 209 lb (94.802  kg). Physical Exam The patient is well developed well nourished and well groomed.  Orientation to person place and time is normal  Mood is pleasant.  Ambulatory status Normal gait Cervical spine exam is as follows no tenderness full range of motion Ortho Exam Right shoulder  Examination: Inspection reveals tenderness around the peri-acromial region. The patient has decreased range of motion and grade 5/ 5 motor function of the rotator cuff. Stability in abduction external rotation is normal.   Neurovascular examination is intact and the lymph nodes in the axilla and supraclavicular regions are normal   The opposite shoulder exhibits normal range of motion stability and strength neurovascular exam is intact, lymph nodes are negative and there is no swelling or tenderness   Data Reviewed IMAGING: AP/LAT right Shoulder: I have read and interpret the x-ray as follows:   mild before meals joint arthritis, humeral joint normal   Assessment    Right SHOULDER  ROTATOR CUFF SYNDROME Plan    NSAIDS Over-the-counter if needed but call us if any problems or shoulder gets worse currently getting better

## 2016-04-19 ENCOUNTER — Encounter: Payer: Self-pay | Admitting: Nurse Practitioner

## 2016-04-19 ENCOUNTER — Other Ambulatory Visit: Payer: Self-pay

## 2016-04-19 ENCOUNTER — Ambulatory Visit (INDEPENDENT_AMBULATORY_CARE_PROVIDER_SITE_OTHER): Payer: 59 | Admitting: Nurse Practitioner

## 2016-04-19 DIAGNOSIS — Z86010 Personal history of colonic polyps: Secondary | ICD-10-CM | POA: Diagnosis not present

## 2016-04-19 DIAGNOSIS — K5900 Constipation, unspecified: Secondary | ICD-10-CM | POA: Insufficient documentation

## 2016-04-19 MED ORDER — PEG 3350-KCL-NA BICARB-NACL 420 G PO SOLR: 4000.0000 mL | ORAL | 0 refills | Status: DC

## 2016-04-19 NOTE — Assessment & Plan Note (Signed)
The majority of the time he does not have any symptoms. However, intermittent/rare constipation. We'll recommend daily fiber supplement and MiraLAX as needed for flareup of constipation. Return for follow-up as needed.

## 2016-04-19 NOTE — Assessment & Plan Note (Signed)
The patient has a history of colon polyps, last colonoscopy 07/19/2011 which found multiple polyps which were a mix of hyperplastic and tubular adenoma. Recommended 3 year repeat, he was about one and a half years overdue at this point. He is generally asymptomatic from a GI standpoint other than rare/intermittent constipation for which I told him I would give over-the-counter recommendations to help on the rare occasion. We will proceed with recommended colonoscopy, return for follow-up as needed or as recommended after colonoscopy.  Proceed with TCS with Dr. Gala Romney in near future: the risks, benefits, and alternatives have been discussed with the patient in detail. The patient states understanding and desires to proceed.  The patient is not on any anticoagulants, anxiolytics, chronic pain medications, or antidepressants. Conscious sedation should be adequate for his procedure.

## 2016-04-19 NOTE — Progress Notes (Signed)
CC'ED TO PCP 

## 2016-04-19 NOTE — Patient Instructions (Signed)
1. We will schedule your procedure for you 2. You can try taking a daily fiber supplement, such as Benefiber, once a day. 3. An sure you're drinking adequate water. 4. If you have a flare up of constipation using try MiraLAX, 17 g, one to 2 times a day as needed. This is available over-the-counter. 5. Return for follow-up as needed or as recommended after your procedure.

## 2016-04-19 NOTE — Progress Notes (Signed)
Primary Care Physician:  Wende Neighbors, MD Primary Gastroenterologist:  Dr. Gala Romney  Chief Complaint  Patient presents with  . Colonoscopy    hx polyps, not having any problems    HPI:   Wyatt Wilson is a 65 y.o. male who presents for recommended surveilance colonoscopy. The patient was last seen by our office on 07/19/2011 for colonoscopy due to history of polyps. Findings included adequate prep, multiple diminutive rectal and rectosigmoid polyps, sigmoid diverticula, multiple ascending and hepatic flexure polyps with the largest being 7 mm, otherwise normal mucosa. Surgical pathology as per below. Recommended 3 year repeat colonoscopy. He is approximately 1.5 years overdue.  Today he states he's doing well overall. Denies abdominal pain, N/V, hematochezia, melena, unintentional weight loss, fever, chills. Has had intermittent constipation for which he doesn't have to take anything. Also feels like his stools gets "hung up at the end" since his last colonoscopy. Most of the times no issues. Denies chest pain, dyspnea, dizziness, lightheadedness, syncope, near syncope. Denies any other upper or lower GI symptoms.    Past Medical History:  Diagnosis Date  . Arthritis   . Hypertension     Past Surgical History:  Procedure Laterality Date  . COLONOSCOPY  07/19/2011   Procedure: COLONOSCOPY;  Surgeon: Daneil Dolin, MD;  Location: [facility];  Service: Endoscopy;  Laterality: N/A;  9:00 AM; RESULTS: Mix of tubular adenoma and hyperplastic polyps.  . CYSTECTOMY  1988   Cyst removed from back    Current Outpatient Prescriptions  Medication Sig Dispense Refill  . amLODipine (NORVASC) 5 MG tablet Take 5 mg by mouth at bedtime.     Marland Kitchen aspirin EC 81 MG tablet Take 81 mg by mouth daily.    . diclofenac (VOLTAREN) 75 MG EC tablet Take 75 mg by mouth 2 (two) times daily.    Marland Kitchen lisinopril-hydrochlorothiazide (PRINZIDE,ZESTORETIC) 20-25 MG per tablet Take 1 tablet by mouth daily.    .  pravastatin (PRAVACHOL) 20 MG tablet Take 20 mg by mouth daily.     No current facility-administered medications for this visit.     Allergies as of 04/19/2016  . (No Known Allergies)    Family History  Problem Relation Age of Onset  . Alzheimer's disease Father     Social History   Social History  . Marital status: Married    Spouse name: N/A  . Number of children: N/A  . Years of education: N/A   Occupational History  . Not on file.   Social History Main Topics  . Smoking status: Former Smoker    Packs/day: 1.50    Years: 30.00    Types: Cigarettes  . Smokeless tobacco: Never Used  . Alcohol use Yes     Comment: Once a month  . Drug use: No  . Sexual activity: Yes   Other Topics Concern  . Not on file   Social History Narrative  . No narrative on file    Review of Systems: General: Negative for anorexia, weight loss, fever, chills, fatigue, weakness. ENT: Negative for hoarseness, nasal congestion. CV: Negative for chest pain, angina, palpitations, peripheral edema.  Respiratory: Negative for dyspnea at rest, cough, sputum, wheezing.  GI: See history of present illness. MS: Negative for joint pain, low back pain.  Derm: Negative for rash or itching.  Endo: Negative for unusual weight change.  Heme: Negative for bruising or bleeding. Allergy: Negative for rash or hives.    Physical Exam: BP (!) 146/85  Pulse 64   Temp 97.7 F (36.5 C) (Oral)   Ht 6' (1.829 m)   Wt 214 lb 6.4 oz (97.3 kg)   BMI 29.08 kg/m  General:   Alert and oriented. Pleasant and cooperative. Well-nourished and well-developed.  Head:  Normocephalic and atraumatic. Eyes:  Without icterus, sclera clear and conjunctiva pink.  Ears:  Normal auditory acuity. Cardiovascular:  S1, S2 present without murmurs appreciated. Extremities without clubbing or edema. Respiratory:  Clear to auscultation bilaterally. No wheezes, rales, or rhonchi. No distress.  Gastrointestinal:  +BS, soft,  non-tender and non-distended. No HSM noted. No guarding or rebound. No masses appreciated.  Rectal:  Deferred  Musculoskalatal:  Symmetrical without gross deformities. Skin:  Intact without significant lesions or rashes. Neurologic:  Alert and oriented x4;  grossly normal neurologically. Psych:  Alert and cooperative. Normal mood and affect. Heme/Lymph/Immune: No excessive bruising noted.    04/19/2016 8:34 AM   Disclaimer: This note was dictated with voice recognition software. Similar sounding words can inadvertently be transcribed and may not be corrected upon review.

## 2016-04-24 ENCOUNTER — Encounter (HOSPITAL_COMMUNITY)
Admission: RE | Disposition: A | Discharge status: Home / Self Care | Payer: Self-pay | Source: Ambulatory Visit | Attending: Internal Medicine

## 2016-04-24 ENCOUNTER — Encounter (HOSPITAL_COMMUNITY): Payer: Self-pay

## 2016-04-24 ENCOUNTER — Ambulatory Visit (HOSPITAL_COMMUNITY)
Admission: RE | Admit: 2016-04-24 | Discharge: 2016-04-24 | Disposition: A | Discharge status: Home / Self Care | Payer: 59 | Source: Ambulatory Visit | Attending: Internal Medicine | Admitting: Internal Medicine

## 2016-04-24 DIAGNOSIS — Z86010 Personal history of colonic polyps: Secondary | ICD-10-CM | POA: Diagnosis not present

## 2016-04-24 DIAGNOSIS — I10 Essential (primary) hypertension: Secondary | ICD-10-CM | POA: Diagnosis not present

## 2016-04-24 DIAGNOSIS — Z7982 Long term (current) use of aspirin: Secondary | ICD-10-CM | POA: Insufficient documentation

## 2016-04-24 DIAGNOSIS — M1990 Unspecified osteoarthritis, unspecified site: Secondary | ICD-10-CM | POA: Insufficient documentation

## 2016-04-24 DIAGNOSIS — Z79899 Other long term (current) drug therapy: Secondary | ICD-10-CM | POA: Insufficient documentation

## 2016-04-24 DIAGNOSIS — Z1211 Encounter for screening for malignant neoplasm of colon: Secondary | ICD-10-CM | POA: Insufficient documentation

## 2016-04-24 DIAGNOSIS — Z87891 Personal history of nicotine dependence: Secondary | ICD-10-CM | POA: Diagnosis not present

## 2016-04-24 DIAGNOSIS — K635 Polyp of colon: Secondary | ICD-10-CM | POA: Insufficient documentation

## 2016-04-24 HISTORY — PX: COLONOSCOPY: SHX5424

## 2016-04-24 SURGERY — COLONOSCOPY
Anesthesia: Moderate Sedation

## 2016-04-24 MED ORDER — ONDANSETRON HCL 4 MG/2ML IJ SOLN
INTRAMUSCULAR | Status: AC
  Filled 2016-04-24: qty 2

## 2016-04-24 MED ORDER — SODIUM CHLORIDE 0.9 % IV SOLN
INTRAVENOUS | Status: DC
  Administered 2016-04-24: 14:00:00 via INTRAVENOUS

## 2016-04-24 MED ORDER — MIDAZOLAM HCL 5 MG/5ML IJ SOLN
INTRAMUSCULAR | Status: AC
  Filled 2016-04-24: qty 10

## 2016-04-24 MED ORDER — ONDANSETRON HCL 4 MG/2ML IJ SOLN
INTRAMUSCULAR | Status: DC | PRN
  Administered 2016-04-24: 4 mg via INTRAVENOUS

## 2016-04-24 MED ORDER — MIDAZOLAM HCL 5 MG/5ML IJ SOLN
INTRAMUSCULAR | Status: DC | PRN
  Administered 2016-04-24: 1 mg via INTRAVENOUS
  Administered 2016-04-24 (×2): 2 mg via INTRAVENOUS

## 2016-04-24 MED ORDER — MEPERIDINE HCL 100 MG/ML IJ SOLN
INTRAMUSCULAR | Status: AC
  Filled 2016-04-24: qty 2

## 2016-04-24 MED ORDER — STERILE WATER FOR IRRIGATION IR SOLN
Status: DC | PRN
  Administered 2016-04-24: 15:00:00

## 2016-04-24 MED ORDER — MEPERIDINE HCL 100 MG/ML IJ SOLN
INTRAMUSCULAR | Status: DC | PRN
  Administered 2016-04-24: 50 mg via INTRAVENOUS
  Administered 2016-04-24 (×2): 25 mg via INTRAVENOUS

## 2016-04-24 NOTE — Interval H&P Note (Signed)
History and Physical Interval Note:  04/24/2016 2:54 PM  Wyatt Wilson  has presented today for surgery, with the diagnosis of HISTORY OF POLYPS  The various methods of treatment have been discussed with the patient and family. After consideration of risks, benefits and other options for treatment, the patient has consented to  Procedure(s) with comments: COLONOSCOPY (N/A) - 245 as a surgical intervention .  The patient's history has been reviewed, patient examined, no change in status, stable for surgery.  I have reviewed the patient's chart and labs.  Questions were answered to the patient's satisfaction.     Wyatt Wilson  No change. Surveillance colonoscopy per plan. The risks, benefits, limitations, alternatives and imponderables have been reviewed with the patient. Questions have been answered. All parties are agreeable.

## 2016-04-24 NOTE — Discharge Instructions (Signed)

## 2016-04-24 NOTE — Op Note (Signed)
[facility] Patient Name: Wyatt Wilson Procedure Date: 04/24/2016 2:56 PM MRN: [id number] Date of Birth: [birth year] Attending MD: Norvel Richards , MD CSN: [id number] Age: 65 Admit Type: Outpatient Procedure:                Colonoscopy with snare polypectomy Indications:              High risk colon cancer surveillance: Personal                            history of colonic polyps Providers:                Norvel Richards, MD, Otis Peak B. Sharon Seller, RN,                            [employer]. Loyal, [employer] Referring MD:              Medicines:                Midazolam 4 mg IV, Meperidine 123XX123 mg IV Complications:            No immediate complications. Estimated Blood Loss:     Estimated blood loss was minimal. Procedure:                Pre-Anesthesia Assessment:                           - Prior to the procedure, a History and Physical                            was performed, and patient medications and                            allergies were reviewed. The patient's tolerance of                            previous anesthesia was also reviewed. The risks                            and benefits of the procedure and the sedation                            options and risks were discussed with the patient.                            All questions were answered, and informed consent                            was obtained. Prior Anticoagulants: The patient has                            taken no previous anticoagulant or antiplatelet                            agents. ASA Grade Assessment: II - A patient with  mild systemic disease. After reviewing the risks                            and benefits, the patient was deemed in                            satisfactory condition to undergo the procedure.                           After obtaining informed consent, the colonoscope                            was passed under direct vision. Throughout the                            procedure, the patient's blood pressure, pulse, and                            oxygen saturations were monitored continuously. The                            Colonoscope was introduced through the anus and                            advanced to the the cecum, identified by                            appendiceal orifice and ileocecal valve. The                            colonoscopy was performed without difficulty. The                            patient tolerated the procedure well. The quality                            of the bowel preparation was adequate. The entire                            colon was well visualized. The patient tolerated                            the procedure well. The quality of the bowel                            preparation was adequate. The ileocecal valve,                            appendiceal orifice, and rectum were photographed. Scope In: 3:09:43 PM Scope Out: 3:30:36 PM Scope Withdrawal Time: 0 hours 16 minutes 41 seconds  Total Procedure Duration: 0 hours 20 minutes 53 seconds  Findings:      The perianal and digital rectal examinations were normal.      A 4 mm polyp was found in the  ascending colon. The polyp was sessile.       The polyp was removed with a cold snare. Resection and retrieval were       complete. Estimated blood loss was minimal.      Two sessile polyps were found in the sigmoid colon. The polyps were 4 to       5 mm in size. These polyps were removed with a cold snare. Resection and       retrieval were complete. Estimated blood loss was minimal.      The exam was otherwise without abnormality on direct and retroflexion       views. Impression:               - One 4 mm polyp in the ascending colon, removed                            with a cold snare. Resected and retrieved.                           - Two 4 to 5 mm polyps in the sigmoid colon,                            removed with a cold snare. Resected  and retrieved.                           - The examination was otherwise normal on direct                            and retroflexion views. Moderate Sedation:      Moderate (conscious) sedation was administered by the endoscopy nurse       and supervised by the endoscopist. The following parameters were       monitored: oxygen saturation, heart rate, blood pressure, respiratory       rate, EKG, adequacy of pulmonary ventilation, and response to care.       Total physician intraservice time was 33 minutes. Recommendation:           - Patient has a contact number available for                            emergencies. The signs and symptoms of potential                            delayed complications were discussed with the                            patient. Return to normal activities tomorrow.                            Written discharge instructions were provided to the                            patient.                           - Resume previous diet.                           -  Continue present medications.                           - Repeat colonoscopy date to be determined after                            pending pathology results are reviewed for                            surveillance based on pathology results.                           - Return to GI office PRN. Procedure Code(s):        --- Professional ---                           [phone], Colonoscopy, flexible; with removal of                            tumor(s), polyp(s), or other lesion(s) by snare                            technique                           99152, Moderate sedation services provided by the                            same physician or other qualified health care                            professional performing the diagnostic or                            therapeutic service that the sedation supports,                            requiring the presence of an independent trained                             observer to assist in the monitoring of the                            patient's level of consciousness and physiological                            status; initial 15 minutes of intraservice time,                            patient age 59 years or older                           [phone], Moderate sedation services; each additional  15 minutes intraservice time Diagnosis Code(s):        --- Professional ---                           Z86.010, Personal history of colonic polyps                           D12.2, Benign neoplasm of ascending colon                           D12.5, Benign neoplasm of sigmoid colon CPT copyright 2016 [employer]. All rights reserved. The codes documented in this report are preliminary and upon coder review may  be revised to meet current compliance requirements. Wyatt Wilson. Wyatt Searson, MD Norvel Richards, MD 04/24/2016 3:46:50 PM This report has been signed electronically. Number of Addenda: 0

## 2016-04-24 NOTE — H&P (View-Only) (Signed)
Primary Care Physician:  Wende Neighbors, MD Primary Gastroenterologist:  Dr. Gala Romney  Chief Complaint  Patient presents with  . Colonoscopy    hx polyps, not having any problems    HPI:   Wyatt Wilson is a 65 y.o. male who presents for recommended surveilance colonoscopy. The patient was last seen by our office on 07/19/2011 for colonoscopy due to history of polyps. Findings included adequate prep, multiple diminutive rectal and rectosigmoid polyps, sigmoid diverticula, multiple ascending and hepatic flexure polyps with the largest being 7 mm, otherwise normal mucosa. Surgical pathology as per below. Recommended 3 year repeat colonoscopy. He is approximately 1.5 years overdue.  Today he states he's doing well overall. Denies abdominal pain, N/V, hematochezia, melena, unintentional weight loss, fever, chills. Has had intermittent constipation for which he doesn't have to take anything. Also feels like his stools gets "hung up at the end" since his last colonoscopy. Most of the times no issues. Denies chest pain, dyspnea, dizziness, lightheadedness, syncope, near syncope. Denies any other upper or lower GI symptoms.    Past Medical History:  Diagnosis Date  . Arthritis   . Hypertension     Past Surgical History:  Procedure Laterality Date  . COLONOSCOPY  07/19/2011   Procedure: COLONOSCOPY;  Surgeon: Daneil Dolin, MD;  [facility];  Service: Endoscopy;  Laterality: N/A;  9:00 AM; RESULTS: Mix of tubular adenoma and hyperplastic polyps.  . CYSTECTOMY  1988   Cyst removed from back    Current Outpatient Prescriptions  Medication Sig Dispense Refill  . amLODipine (NORVASC) 5 MG tablet Take 5 mg by mouth at bedtime.     Marland Kitchen aspirin EC 81 MG tablet Take 81 mg by mouth daily.    . diclofenac (VOLTAREN) 75 MG EC tablet Take 75 mg by mouth 2 (two) times daily.    Marland Kitchen lisinopril-hydrochlorothiazide (PRINZIDE,ZESTORETIC) 20-25 MG per tablet Take 1 tablet by mouth daily.    .  pravastatin (PRAVACHOL) 20 MG tablet Take 20 mg by mouth daily.     No current facility-administered medications for this visit.     Allergies as of 04/19/2016  . (No Known Allergies)    Family History  Problem Relation Age of Onset  . Alzheimer's disease Father     Social History   Social History  . Marital status: Married    Spouse name: N/A  . Number of children: N/A  . Years of education: N/A   Occupational History  . Not on file.   Social History Main Topics  . Smoking status: Former Smoker    Packs/day: 1.50    Years: 30.00    Types: Cigarettes  . Smokeless tobacco: Never Used  . Alcohol use Yes     Comment: Once a month  . Drug use: No  . Sexual activity: Yes   Other Topics Concern  . Not on file   Social History Narrative  . No narrative on file    Review of Systems: General: Negative for anorexia, weight loss, fever, chills, fatigue, weakness. ENT: Negative for hoarseness, nasal congestion. CV: Negative for chest pain, angina, palpitations, peripheral edema.  Respiratory: Negative for dyspnea at rest, cough, sputum, wheezing.  GI: See history of present illness. MS: Negative for joint pain, low back pain.  Derm: Negative for rash or itching.  Endo: Negative for unusual weight change.  Heme: Negative for bruising or bleeding. Allergy: Negative for rash or hives.    Physical Exam: BP (!) 146/85  Pulse 64   Temp 97.7 F (36.5 C) (Oral)   Ht 6' (1.829 m)   Wt 214 lb 6.4 oz (97.3 kg)   BMI 29.08 kg/m  General:   Alert and oriented. Pleasant and cooperative. Well-nourished and well-developed.  Head:  Normocephalic and atraumatic. Eyes:  Without icterus, sclera clear and conjunctiva pink.  Ears:  Normal auditory acuity. Cardiovascular:  S1, S2 present without murmurs appreciated. Extremities without clubbing or edema. Respiratory:  Clear to auscultation bilaterally. No wheezes, rales, or rhonchi. No distress.  Gastrointestinal:  +BS, soft,  non-tender and non-distended. No HSM noted. No guarding or rebound. No masses appreciated.  Rectal:  Deferred  Musculoskalatal:  Symmetrical without gross deformities. Skin:  Intact without significant lesions or rashes. Neurologic:  Alert and oriented x4;  grossly normal neurologically. Psych:  Alert and cooperative. Normal mood and affect. Heme/Lymph/Immune: No excessive bruising noted.    04/19/2016 8:34 AM   Disclaimer: This note was dictated with voice recognition software. Similar sounding words can inadvertently be transcribed and may not be corrected upon review.

## 2016-04-27 ENCOUNTER — Encounter: Payer: Self-pay | Admitting: Internal Medicine

## 2016-04-27 ENCOUNTER — Encounter (HOSPITAL_COMMUNITY): Payer: Self-pay | Admitting: Internal Medicine

## 2016-05-24 ENCOUNTER — Telehealth: Payer: Self-pay | Admitting: Internal Medicine

## 2016-05-24 NOTE — Telephone Encounter (Signed)
Pt is calling because he doesn't understand why he received a bill. He had a procedure back in December.  I offered him the billing office number but he said he called them and was told to call us. Please call him back at [phone]

## 2016-05-30 NOTE — Telephone Encounter (Signed)
I tried to call the patient, no answer,lmom 

## 2016-06-06 NOTE — Telephone Encounter (Signed)
I spoke with the patient and made him aware that he had a polyp removed and usually most insurance companies would consider that medical and apply the claim to his deductible.  He voiced understanding and thanked me for calling.

## 2017-06-21 DIAGNOSIS — L570 Actinic keratosis: Secondary | ICD-10-CM | POA: Diagnosis not present

## 2017-06-21 DIAGNOSIS — D2262 Melanocytic nevi of left upper limb, including shoulder: Secondary | ICD-10-CM | POA: Diagnosis not present

## 2017-06-21 DIAGNOSIS — X32XXXD Exposure to sunlight, subsequent encounter: Secondary | ICD-10-CM | POA: Diagnosis not present

## 2017-06-21 DIAGNOSIS — D485 Neoplasm of uncertain behavior of skin: Secondary | ICD-10-CM | POA: Diagnosis not present

## 2017-06-21 DIAGNOSIS — Z1283 Encounter for screening for malignant neoplasm of skin: Secondary | ICD-10-CM | POA: Diagnosis not present

## 2017-07-16 DIAGNOSIS — I10 Essential (primary) hypertension: Secondary | ICD-10-CM | POA: Diagnosis not present

## 2017-07-16 DIAGNOSIS — R7301 Impaired fasting glucose: Secondary | ICD-10-CM | POA: Diagnosis not present

## 2017-07-16 DIAGNOSIS — N529 Male erectile dysfunction, unspecified: Secondary | ICD-10-CM | POA: Diagnosis not present

## 2017-07-16 DIAGNOSIS — Z125 Encounter for screening for malignant neoplasm of prostate: Secondary | ICD-10-CM | POA: Diagnosis not present

## 2017-07-16 DIAGNOSIS — E782 Mixed hyperlipidemia: Secondary | ICD-10-CM | POA: Diagnosis not present

## 2017-07-19 DIAGNOSIS — I10 Essential (primary) hypertension: Secondary | ICD-10-CM | POA: Diagnosis not present

## 2017-07-19 DIAGNOSIS — R1030 Lower abdominal pain, unspecified: Secondary | ICD-10-CM | POA: Diagnosis not present

## 2017-07-19 DIAGNOSIS — M25551 Pain in right hip: Secondary | ICD-10-CM | POA: Diagnosis not present

## 2017-07-19 DIAGNOSIS — E782 Mixed hyperlipidemia: Secondary | ICD-10-CM | POA: Diagnosis not present

## 2017-07-19 DIAGNOSIS — Z6828 Body mass index (BMI) 28.0-28.9, adult: Secondary | ICD-10-CM | POA: Diagnosis not present

## 2017-07-19 DIAGNOSIS — Z0000 Encounter for general adult medical examination without abnormal findings: Secondary | ICD-10-CM | POA: Diagnosis not present

## 2017-11-30 ENCOUNTER — Ambulatory Visit: Payer: Medicare HMO | Admitting: Orthopedic Surgery

## 2017-11-30 ENCOUNTER — Ambulatory Visit (INDEPENDENT_AMBULATORY_CARE_PROVIDER_SITE_OTHER): Payer: Medicare HMO

## 2017-11-30 VITALS — BP 131/79 | HR 63 | Ht 70.0 in | Wt 197.0 lb

## 2017-11-30 DIAGNOSIS — M25551 Pain in right hip: Secondary | ICD-10-CM

## 2017-11-30 DIAGNOSIS — M1611 Unilateral primary osteoarthritis, right hip: Secondary | ICD-10-CM | POA: Diagnosis not present

## 2017-11-30 MED ORDER — TRAMADOL-ACETAMINOPHEN 37.5-325 MG PO TABS: 1.0000 | ORAL_TABLET | ORAL | 5 refills | Status: DC | PRN

## 2017-11-30 NOTE — Progress Notes (Signed)
Progress Note   Patient ID: Wyatt Wilson, male   DOB: 10-06-1950, 67 y.o.   MRN: [id number] Chief Complaint  Patient presents with  . Hip Pain    Right hip pain.     Chief Complaint  Patient presents with  . Hip Pain    Right hip pain.    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  He is using a cane because he had trouble walking.  He has pain on the lateral side of the hip anterior thigh radiates to the knee.  Quality dull severity moderate associated with decreased range of motion and gait disturbance    Review of Systems  Constitutional: Negative for chills, fever and weight loss.  Respiratory: Negative for shortness of breath.   Cardiovascular: Negative for chest pain.  Neurological: Negative for tingling.   No outpatient medications have been marked as taking for the 11/30/17 encounter (Office Visit) with Carole Civil, MD.    Past Medical History:  Diagnosis Date  . Arthritis   . Hypercholesterolemia   . Hypertension      No Known Allergies   BP 131/79   Pulse 63   Ht 5\' 10"  (1.778 m)   Wt 197 lb (89.4 kg)   BMI 28.27 kg/m    Physical Exam  Constitutional: He is oriented to person, place, and time. He appears well-developed and well-nourished.  Vital signs have been reviewed and are stable. Gen. appearance the patient is well-developed and well-nourished with normal grooming and hygiene.   Neurological: He is alert and oriented to person, place, and time.  Skin: Skin is warm and dry. No erythema.  Psychiatric: He has a normal mood and affect.  Vitals reviewed.   Right Hip Exam   Tenderness  The patient is experiencing tenderness in the  greater trochanter and anterior.  Range of Motion  Right hip abduction: Abduction 15, painful range of motion all planes obligatory external rotation with flexion limited flexion.   Muscle Strength  The patient has normal right hip strength.  Tests  FABER: negative  Other  Erythema: absent Sensation: normal Pulse: present  Comments:  HIP STABILITY NORMAL    Left Hip Exam  Left hip exam is normal.  Tenderness  The patient is experiencing no tenderness.   Range of Motion  The patient has normal left hip ROM.  Muscle Strength  The patient has normal left hip strength.   Tests  FABER: negative  Other  Erythema: absent Sensation: normal Pulse: present  Comments:  HIP STABILITY NORMAL       Arther Abbott, MD   MEDICAL DECISION MAKING   Imaging:  [facility], see dictated report  X-rays show severe arthritis acetabular and femoral head cyst formation with femoral head deformity and severe arthritis  NEW PROBLEM  Encounter Diagnoses  Name Primary?  . Pain of right hip joint   . Primary osteoarthritis of right hip Yes     PLAN: (RX., injection, surgery,frx,mri/ct, XR 2 body ares) I started him on Ultracet for pain and send him to Dr. Ninfa Linden for hip replacement  No orders of the defined types were placed in this  encounter.  10:48 AM 11/30/2017

## 2017-11-30 NOTE — Patient Instructions (Signed)
Total Hip Replacement Total hip replacement is a surgery to replace your damaged hip joint. Your hip joint is replaced with a man-made (artificial) hip joint. This man-made hip joint is called a prosthesis. This surgery is done to lessen pain and improve movement. What happens before the procedure?  Do not eat or drink anything after midnight on the night before the procedure or as told by your doctor.  Ask your doctor about: ? Changing or stopping your normal medicines. This is important if you take diabetes medicines or blood thinners. ? Taking aspirin or ibuprofen medicines. These thin your blood. Do not take these medicines if your doctor tells you not to.  Plan to have someone take you home after the procedure.  Ask your health care team how your surgery site will be marked.  You may be given medicines that kill germs (antibiotics) to help prevent infection. What happens during the procedure?  To help prevent infection: ? Your health care team will wash or sanitize their hands. ? Your skin will be washed with soap.  An IV tube will be put into one of your veins.  You will be given one or more of the following: ? A medicine that makes you relaxed (sedative). ? A medicine that makes you fall asleep (general anesthetic). ? A medicine that numbs your body below the waist (spinal anesthetic).  A cut (incision) will be made in your hip. Your surgeon will take out any damaged parts of your hip joint.  Your surgeon will then: ? Put a man-made hip joint into your pelvic bone. Screws may be used to keep the hip joint in place. ? Take out the damaged ball of your thigh bone (femur). A man-made ball on a metal pole will replace the damaged ball. ? The ball will be put into the new socket to make a new hip joint. Your hip joint will be checked to see if it moves as it should. ? Close the cut and place a bandage over it. What happens after the procedure?  You will stay in a recovery area  until your medicines wear off.  Your nurse will monitor your vital signs. These include: ? Your pulse. ? Your blood pressure.  Once you are doing okay, you will be taken to your hospital room.  You may be told to take actions to help prevent blood clots. These may include: ? Walking soon after surgery with someone helping you. Moving around helps to improve blood flow. ? Taking medicines to thin your blood (anticoagulants). ? Wearing special socks (compression stockings) or using other types of devices.  You will do exercise therapy (physical therapy) until you are doing well. Your doctor will tell you when you are well enough to go home. This information is not intended to replace advice given to you by your health care provider. Make sure you discuss any questions you have with your health care provider. Document Released: 07/24/2011 Document Revised: 01/03/2016 Document Reviewed: 07/02/2013 Elsevier Interactive Patient Education  [employer].

## 2017-12-11 DIAGNOSIS — M25551 Pain in right hip: Secondary | ICD-10-CM | POA: Diagnosis not present

## 2017-12-11 DIAGNOSIS — S30860A Insect bite (nonvenomous) of lower back and pelvis, initial encounter: Secondary | ICD-10-CM | POA: Diagnosis not present

## 2017-12-11 DIAGNOSIS — S80829A Blister (nonthermal), unspecified lower leg, initial encounter: Secondary | ICD-10-CM | POA: Diagnosis not present

## 2017-12-11 DIAGNOSIS — Z6828 Body mass index (BMI) 28.0-28.9, adult: Secondary | ICD-10-CM | POA: Diagnosis not present

## 2017-12-12 ENCOUNTER — Ambulatory Visit (INDEPENDENT_AMBULATORY_CARE_PROVIDER_SITE_OTHER): Payer: Medicare HMO | Admitting: Orthopaedic Surgery

## 2017-12-12 ENCOUNTER — Encounter (INDEPENDENT_AMBULATORY_CARE_PROVIDER_SITE_OTHER): Payer: Self-pay | Admitting: Orthopaedic Surgery

## 2017-12-12 DIAGNOSIS — M1611 Unilateral primary osteoarthritis, right hip: Secondary | ICD-10-CM | POA: Insufficient documentation

## 2017-12-12 NOTE — Progress Notes (Signed)
Office Visit Note   Patient: Wyatt Wilson           Date of Birth: Jan 25, 1951           MRN: [id number] Visit Date: 12/12/2017              Requested by: Carole Civil, MD [address] [place],  [address] PCP: Celene Squibb, MD   Assessment & Plan: Visit Diagnoses:  1. Unilateral primary osteoarthritis, right hip     Plan: At this point I agree that a hip replacement surgery would help him significantly.  There is no other intervention recommended at this point and even a steroid injection would not help given the lack of joint space at this point.  I gave him a hand about hip replacement surgery.  We went over his x-rays in detail.  I described the surgery in detail as well as had a discussion of his intraoperative and postoperative course and discussing the benefits and risks involved.  He wants to think about having surgery done in December of this year.  With that being said I will see him back in 3 months from now for further evaluation treatment as well as discussion of her ablation surgery and hopefully history and physical exam then for setting him up for his December surgery.  All question concerns were answered and addressed.  Follow-Up Instructions: Return in about 3 months (around 03/14/2018).   Orders:  No orders of the defined types were placed in this encounter.  No orders of the defined types were placed in this encounter.     Procedures: No procedures performed   Clinical Data: No additional findings.   Subjective: Chief Complaint  Patient presents with  . Right Hip - Pain  The patient is very pleasant 67 year old male referred from Dr. Aline Brochure in [place] to evaluate and treat severe hip osteoarthritis.  He has been seen by Dr. Aline Brochure and definitely needs a hip replacement.  He is gotten to where now he is walking with a quad cane to offload his right hip.  His pain is in the groin is daily.  His hip is stiff he states.  He said he  walks with a significant limp and now is having trouble getting shoes and socks on and off.  X-rays on the canopy system for me to review.  He says his pain is daily and it is detrimentally affecting his activities daily living, his quality of life, his mobility.  He is an avid golfer as well.  He does have high blood pressure.  He is not a diabetic and not a smoker.  His pain is been worsening over several years now.  He is tried activity modification and hip strengthening exercises during this time as well.  HPI  Review of Systems He currently denies any headache, chest pain, shortness of breath, fever, chills, nausea, vomiting.  Objective: Vital Signs: There were no vitals taken for this visit.  Physical Exam He is alert and oriented x3 and in no acute distress Ortho Exam Examination of his right hip essentially shows almost no range of motion all.  The hip is very stiff and has severe pain with attempts of internal or external rotation.  The left hip moves normally. Specialty Comments:  No specialty comments available.  Imaging: No results found. X-rays on the canopy system of his pelvis and right hip show severe end-stage arthritis of the right hip.  There is complete loss of  joint space.  There is large para-articular osteophytes all around the hip.  There is sclerotic and cystic changes in the femoral head and acetabulum on both sides.    His  PMFS History: Patient Active Problem List   Diagnosis Date Noted  . Unilateral primary osteoarthritis, right hip 12/12/2017  . History of colonic polyps 04/19/2016  . Constipation 04/19/2016  . HIP, ARTHRITIS, DEGEN./OSTEO 07/14/2009   Past Medical History:  Diagnosis Date  . Arthritis   . Hypercholesterolemia   . Hypertension     Family History  Problem Relation Age of Onset  . Alzheimer's disease Father   . Colon cancer Neg Hx   . Colon polyps Neg Hx     Past Surgical History:  Procedure Laterality Date  . COLONOSCOPY   07/19/2011   Procedure: COLONOSCOPY;  Surgeon: Daneil Dolin, MD;  [facility];  Service: Endoscopy;  Laterality: N/A;  9:00 AM; RESULTS: Mix of tubular adenoma and hyperplastic polyps.  . COLONOSCOPY N/A 04/24/2016   Procedure: COLONOSCOPY;  Surgeon: Daneil Dolin, MD;  [facility];  Service: Endoscopy;  Laterality: N/A;  245  . CYSTECTOMY  1988   Cyst removed from back   Social History   Occupational History  . Not on file  Tobacco Use  . Smoking status: Former Smoker    Packs/day: 1.50    Years: 30.00    Pack years: 45.00    Types: Cigarettes    Last attempt to quit: 03/27/2013    Years since quitting: 4.7  . Smokeless tobacco: Never Used  Substance and Sexual Activity  . Alcohol use: Yes    Comment: Rarely (once every several months)  . Drug use: No  . Sexual activity: Yes

## 2017-12-13 DIAGNOSIS — B880 Other acariasis: Secondary | ICD-10-CM | POA: Diagnosis not present

## 2018-01-10 DIAGNOSIS — H524 Presbyopia: Secondary | ICD-10-CM | POA: Diagnosis not present

## 2018-01-22 DIAGNOSIS — E782 Mixed hyperlipidemia: Secondary | ICD-10-CM | POA: Diagnosis not present

## 2018-01-22 DIAGNOSIS — N529 Male erectile dysfunction, unspecified: Secondary | ICD-10-CM | POA: Diagnosis not present

## 2018-01-22 DIAGNOSIS — I10 Essential (primary) hypertension: Secondary | ICD-10-CM | POA: Diagnosis not present

## 2018-01-22 DIAGNOSIS — E6609 Other obesity due to excess calories: Secondary | ICD-10-CM | POA: Diagnosis not present

## 2018-01-22 DIAGNOSIS — K219 Gastro-esophageal reflux disease without esophagitis: Secondary | ICD-10-CM | POA: Diagnosis not present

## 2018-01-22 DIAGNOSIS — Z720 Tobacco use: Secondary | ICD-10-CM | POA: Diagnosis not present

## 2018-01-22 DIAGNOSIS — R7301 Impaired fasting glucose: Secondary | ICD-10-CM | POA: Diagnosis not present

## 2018-01-22 DIAGNOSIS — H1045 Other chronic allergic conjunctivitis: Secondary | ICD-10-CM | POA: Diagnosis not present

## 2018-01-22 DIAGNOSIS — L409 Psoriasis, unspecified: Secondary | ICD-10-CM | POA: Diagnosis not present

## 2018-01-30 DIAGNOSIS — Z6828 Body mass index (BMI) 28.0-28.9, adult: Secondary | ICD-10-CM | POA: Diagnosis not present

## 2018-01-30 DIAGNOSIS — M25551 Pain in right hip: Secondary | ICD-10-CM | POA: Diagnosis not present

## 2018-01-30 DIAGNOSIS — L409 Psoriasis, unspecified: Secondary | ICD-10-CM | POA: Diagnosis not present

## 2018-01-30 DIAGNOSIS — E782 Mixed hyperlipidemia: Secondary | ICD-10-CM | POA: Diagnosis not present

## 2018-01-30 DIAGNOSIS — R7303 Prediabetes: Secondary | ICD-10-CM | POA: Diagnosis not present

## 2018-01-30 DIAGNOSIS — R7301 Impaired fasting glucose: Secondary | ICD-10-CM | POA: Diagnosis not present

## 2018-01-30 DIAGNOSIS — K219 Gastro-esophageal reflux disease without esophagitis: Secondary | ICD-10-CM | POA: Diagnosis not present

## 2018-01-30 DIAGNOSIS — I10 Essential (primary) hypertension: Secondary | ICD-10-CM | POA: Diagnosis not present

## 2018-03-06 DIAGNOSIS — Z23 Encounter for immunization: Secondary | ICD-10-CM | POA: Diagnosis not present

## 2018-03-14 ENCOUNTER — Ambulatory Visit (INDEPENDENT_AMBULATORY_CARE_PROVIDER_SITE_OTHER): Payer: Medicare HMO | Admitting: Orthopaedic Surgery

## 2018-03-21 ENCOUNTER — Ambulatory Visit (INDEPENDENT_AMBULATORY_CARE_PROVIDER_SITE_OTHER): Payer: Medicare HMO | Admitting: Physician Assistant

## 2018-03-21 ENCOUNTER — Encounter (INDEPENDENT_AMBULATORY_CARE_PROVIDER_SITE_OTHER): Payer: Self-pay | Admitting: Physician Assistant

## 2018-03-21 DIAGNOSIS — M1611 Unilateral primary osteoarthritis, right hip: Secondary | ICD-10-CM

## 2018-03-21 NOTE — Progress Notes (Signed)
Office Visit Note   Patient: Wyatt Wilson           Date of Birth: 06-Mar-1951           MRN: [id number] Visit Date: 03/21/2018              Requested by: Celene Squibb, MD [place], [place] [address] PCP: Celene Squibb, MD   Assessment & Plan: Visit Diagnoses:  1. Primary osteoarthritis of right hip     Plan: Discussed with patient at length the postoperative protocol.  Had already discussed surgery with Dr. Ninfa Linden I answered questions and encouraged him at length.  Discussed with him he needed to refrain from donating platelets between now and surgery.  He will follow-up with [place] 2 weeks postop.  Per his wishes we will proceed with a right total hip arthroplasty in the near future.  Follow-Up Instructions: Return for 2 weeks postop.   Orders:  No orders of the defined types were placed in this encounter.  No orders of the defined types were placed in this encounter.     Procedures: No procedures performed   Clinical Data: No additional findings.   Subjective: Chief Complaint  Patient presents with  . Right Hip - Pain    HPI Wyatt Wilson is a 67 year old male comes in today with right hip pain/end-stage arthritis right hip that has been getting worse over the last 3 months since his last saw Dr. Ninfa Linden.  States that he has significant stiffness and has trouble donning shoes on the right.  He is now walking with a cane.  He is having no waking pain.  He is unable to lie on the right side.  He would like to schedule right total hip arthroplasty mid December.  He also would like to go to [facility] skilled [facility] facility and rehab in the [place] postoperatively..  Review of Systems Denies any fevers chills shortness of breath chest pain or infections.  Objective:   Vital Signs: There were no vitals taken for this visit.  Physical Exam  Constitutional: He is oriented to person, place, and time. He appears well-developed and well-nourished. No  distress.  Pulmonary/Chest: Effort normal.  Neurological: He is alert and oriented to person, place, and time.  Skin: He is not diaphoretic.  Psychiatric: He has a normal mood and affect.    Ortho Exam Right hip he has limited external rotation and severely limited internal rotation.  Left hip full range of motion without pain.  Bilateral calf supple nontender.  Right leg slightly shorter than left knee exam. Specialty Comments:  No specialty comments available.  Imaging: No results found.   PMFS History: Patient Active Problem List   Diagnosis Date Noted  . Primary osteoarthritis of right hip 12/12/2017  . History of colonic polyps 04/19/2016  . Constipation 04/19/2016  . HIP, ARTHRITIS, DEGEN./OSTEO 07/14/2009   Past Medical History:  Diagnosis Date  . Arthritis   . Hypercholesterolemia   . Hypertension     Family History  Problem Relation Age of Onset  . Alzheimer's disease Father   . Colon cancer Neg Hx   . Colon polyps Neg Hx     Past Surgical History:  Procedure Laterality Date  . COLONOSCOPY  07/19/2011   Procedure: COLONOSCOPY;  Surgeon: Daneil Dolin, MD;  [facility];  Service: Endoscopy;  Laterality: N/A;  9:00 AM; RESULTS: Mix of tubular adenoma and hyperplastic polyps.  . COLONOSCOPY N/A 04/24/2016  Procedure: COLONOSCOPY;  Surgeon: Daneil Dolin, MD;  [facility];  Service: Endoscopy;  Laterality: N/A;  245  . CYSTECTOMY  1988   Cyst removed from back   Social History   Occupational History  . Not on file  Tobacco Use  . Smoking status: Former Smoker    Packs/day: 1.50    Years: 30.00    Pack years: 45.00    Types: Cigarettes    Last attempt to quit: 03/27/2013    Years since quitting: 4.9  . Smokeless tobacco: Never Used  Substance and Sexual Activity  . Alcohol use: Yes    Comment: Rarely (once every several months)  . Drug use: No  . Sexual activity: Yes

## 2018-04-08 ENCOUNTER — Encounter (HOSPITAL_COMMUNITY)
Admission: RE | Admit: 2018-04-08 | Discharge: 2018-04-08 | Disposition: A | Discharge status: Home / Self Care | Payer: Medicare HMO | Source: Ambulatory Visit | Attending: Orthopaedic Surgery | Admitting: Orthopaedic Surgery

## 2018-04-08 ENCOUNTER — Encounter (HOSPITAL_COMMUNITY): Payer: Self-pay

## 2018-04-08 ENCOUNTER — Other Ambulatory Visit: Payer: Self-pay

## 2018-04-08 DIAGNOSIS — Z01818 Encounter for other preprocedural examination: Secondary | ICD-10-CM | POA: Insufficient documentation

## 2018-04-08 DIAGNOSIS — I10 Essential (primary) hypertension: Secondary | ICD-10-CM | POA: Diagnosis not present

## 2018-04-08 DIAGNOSIS — R001 Bradycardia, unspecified: Secondary | ICD-10-CM | POA: Insufficient documentation

## 2018-04-08 LAB — CBC
HCT: 44.2 % (ref 39.0–52.0)
HEMOGLOBIN: 13.5 g/dL (ref 13.0–17.0)
MCH: 28.2 pg (ref 26.0–34.0)
MCHC: 30.5 g/dL (ref 30.0–36.0)
MCV: 92.3 fL (ref 80.0–100.0)
PLATELETS: 288 10*3/uL (ref 150–400)
RBC: 4.79 MIL/uL (ref 4.22–5.81)
RDW: 13.2 % (ref 11.5–15.5)
WBC: 8.5 10*3/uL (ref 4.0–10.5)
nRBC: 0 % (ref 0.0–0.2)

## 2018-04-08 LAB — SURGICAL PCR SCREEN
MRSA, PCR: NEGATIVE
STAPHYLOCOCCUS AUREUS: NEGATIVE

## 2018-04-08 LAB — BASIC METABOLIC PANEL
Anion gap: 6 (ref 5–15)
BUN: 19 mg/dL (ref 8–23)
CO2: 29 mmol/L (ref 22–32)
Calcium: 9.1 mg/dL (ref 8.9–10.3)
Chloride: 105 mmol/L (ref 98–111)
Creatinine, Ser: 0.97 mg/dL (ref 0.61–1.24)
Glucose, Bld: 82 mg/dL (ref 70–99)
Potassium: 3.1 mmol/L — ABNORMAL LOW (ref 3.5–5.1)
Sodium: 140 mmol/L (ref 135–145)

## 2018-04-08 NOTE — Progress Notes (Signed)
PCP: Allyn Kenner  DM: denies  SA: denies  Pt denies SOB, cough, fever, chest pain  Pt stated understanding of instructions given for DOS.

## 2018-04-08 NOTE — Progress Notes (Signed)
Inboxed surgeon regarding potassium 3.1 at pre-admission appt.

## 2018-04-08 NOTE — Progress Notes (Signed)
Per note found on chart...  Ok per Ebony Hail / Jeneen Rinks to draw labs and perform other tests needed for surgery even though the labs are past the alloted time frame.    Only time pt could get a PAT appointment.

## 2018-04-08 NOTE — Pre-Procedure Instructions (Signed)
Wyatt Wilson  04/08/2018      [facility], [place] - [address] [place] [address] Phone: [phone] Fax: [phone]    Your procedure is scheduled on April 23, 2018.  Report to [facility] Admitting at 5:30 A.M.  Call this number if you have problems the morning of surgery:  [phone]   Remember:  Do not eat or drink after midnight.      Do not wear jewelry.  Do not wear lotions, powders, cologne, or deodorant.  Do not shave 48 hours prior to surgery.  Men may shave face and neck.  Do not bring valuables to the hospital.  [facility] is not responsible for any belongings or valuables.   [facility]- Preparing For Surgery  Before surgery, you can play an important role. Because skin is not sterile, your skin needs to be as free of germs as possible. You can reduce the number of germs on your skin by washing with CHG (chlorahexidine gluconate) Soap before surgery.  CHG is an antiseptic cleaner which kills germs and bonds with the skin to continue killing germs even after washing.    Oral Hygiene is also important to reduce your risk of infection.  Remember - BRUSH YOUR TEETH THE MORNING OF SURGERY WITH YOUR REGULAR TOOTHPASTE  Please do not use if you have an allergy to CHG or antibacterial soaps. If your skin becomes reddened/irritated stop using the CHG.  Do not shave (including legs and underarms) for at least 48 hours prior to first CHG shower. It is OK to shave your face.  Please follow these instructions carefully.   1. Shower the NIGHT BEFORE SURGERY and the MORNING OF SURGERY with CHG.   2. If you chose to wash your hair, wash your hair first as usual with your normal shampoo.  3. After you shampoo, rinse your hair and body thoroughly to remove the shampoo.  4. Use CHG as you would any other liquid soap. You can apply CHG directly to the skin and wash gently with a scrungie or a clean  washcloth.   5. Apply the CHG Soap to your body ONLY FROM THE NECK DOWN.  Do not use on open wounds or open sores. Avoid contact with your eyes, ears, mouth and genitals (private parts). Wash Face and genitals (private parts)  with your normal soap.  6. Wash thoroughly, paying special attention to the area where your surgery will be performed.  7. Thoroughly rinse your body with warm water from the neck down.  8. DO NOT shower/wash with your normal soap after using and rinsing off the CHG Soap.  9. Pat yourself dry with a CLEAN TOWEL.  10. Wear CLEAN PAJAMAS to bed the night before surgery, wear comfortable clothes the morning of surgery  11. Place CLEAN SHEETS on your bed the night of your first shower and DO NOT SLEEP WITH PETS.   Day of Surgery:  Do not apply any deodorants/lotions.  Please wear clean clothes to the hospital/surgery center.   Remember to brush your teeth WITH YOUR REGULAR TOOTHPASTE.    Contacts, dentures or bridgework may not be worn into surgery.  Leave your suitcase in the car.  After surgery it may be brought to your room.  For patients admitted to the hospital, discharge time will be determined by your treatment team.  Patients discharged the day of surgery will not be allowed to drive home.  Please read over the following fact sheets that you were given. Coughing and Deep Breathing, MRSA Information and Surgical Site Infection Prevention

## 2018-04-08 NOTE — Progress Notes (Signed)
No orders for pt as of 04/08/18.  Called and left vm for Almedia Balls at Dr. Trevor Mace office - please put in orders.  Also, messaged Dr. Ninfa Linden on 04/08/18 @ 1135 for orders.

## 2018-04-16 ENCOUNTER — Other Ambulatory Visit (INDEPENDENT_AMBULATORY_CARE_PROVIDER_SITE_OTHER): Payer: Self-pay | Admitting: Orthopaedic Surgery

## 2018-04-16 DIAGNOSIS — M1611 Unilateral primary osteoarthritis, right hip: Secondary | ICD-10-CM

## 2018-04-18 ENCOUNTER — Other Ambulatory Visit (INDEPENDENT_AMBULATORY_CARE_PROVIDER_SITE_OTHER): Payer: Self-pay

## 2018-04-22 MED ORDER — TRANEXAMIC ACID-NACL 1000-0.7 MG/100ML-% IV SOLN
1000.0000 mg | INTRAVENOUS | Status: AC
  Administered 2018-04-23: 1000 mg via INTRAVENOUS
  Filled 2018-04-22: qty 100

## 2018-04-22 NOTE — Anesthesia Preprocedure Evaluation (Addendum)
Anesthesia Evaluation  Patient identified by MRN, date of birth, ID band Patient awake    Reviewed: Allergy & Precautions, NPO status , Patient's Chart, lab work & pertinent test results  History of Anesthesia Complications Negative for: history of anesthetic complications  Airway Mallampati: II  TM Distance: >3 FB Neck ROM: Full    Dental  (+) Partial Lower   Pulmonary neg pulmonary ROS, former smoker,    Pulmonary exam normal        Cardiovascular hypertension, Pt. on medications Normal cardiovascular exam     Neuro/Psych negative neurological ROS  negative psych ROS   GI/Hepatic negative GI ROS, Neg liver ROS,   Endo/Other  negative endocrine ROS  Renal/GU negative Renal ROS  negative genitourinary   Musculoskeletal  (+) Arthritis , Osteoarthritis,    Abdominal   Peds  Hematology negative hematology ROS (+)   Anesthesia Other Findings 67 yo M for R THA - HTN, 66 PY smoking hx, OA - Hgb 13.5, plt 288  Reproductive/Obstetrics                            Anesthesia Physical Anesthesia Plan  ASA: II  Anesthesia Plan: Spinal   Post-op Pain Management:    Induction:   PONV Risk Score and Plan: 1 and Propofol infusion, TIVA and Treatment may vary due to age or medical condition  Airway Management Planned: Nasal Cannula and Simple Face Mask  Additional Equipment: None  Intra-op Plan:   Post-operative Plan:   Informed Consent: I have reviewed the patients History and Physical, chart, labs and discussed the procedure including the risks, benefits and alternatives for the proposed anesthesia with the patient or authorized representative who has indicated his/her understanding and acceptance.     Plan Discussed with:   Anesthesia Plan Comments:        Anesthesia Quick Evaluation

## 2018-04-23 ENCOUNTER — Inpatient Hospital Stay (HOSPITAL_COMMUNITY)
Admission: RE | Admit: 2018-04-23 | Discharge: 2018-04-25 | DRG: 470 | Disposition: A | Discharge status: Home Health | Payer: Medicare HMO | Source: Ambulatory Visit | Attending: Orthopaedic Surgery | Admitting: Orthopaedic Surgery

## 2018-04-23 ENCOUNTER — Inpatient Hospital Stay (HOSPITAL_COMMUNITY): Payer: Medicare HMO | Admitting: Certified Registered Nurse Anesthetist

## 2018-04-23 ENCOUNTER — Inpatient Hospital Stay (HOSPITAL_COMMUNITY): Payer: Medicare HMO

## 2018-04-23 ENCOUNTER — Encounter (HOSPITAL_COMMUNITY): Payer: Self-pay | Admitting: *Deleted

## 2018-04-23 ENCOUNTER — Other Ambulatory Visit: Payer: Self-pay

## 2018-04-23 ENCOUNTER — Encounter (HOSPITAL_COMMUNITY)
Admission: RE | Disposition: A | Discharge status: Home Health | Payer: Self-pay | Source: Ambulatory Visit | Attending: Orthopaedic Surgery

## 2018-04-23 DIAGNOSIS — K219 Gastro-esophageal reflux disease without esophagitis: Secondary | ICD-10-CM | POA: Diagnosis present

## 2018-04-23 DIAGNOSIS — E7800 Pure hypercholesterolemia, unspecified: Secondary | ICD-10-CM | POA: Diagnosis present

## 2018-04-23 DIAGNOSIS — I10 Essential (primary) hypertension: Secondary | ICD-10-CM | POA: Diagnosis not present

## 2018-04-23 DIAGNOSIS — Z419 Encounter for procedure for purposes other than remedying health state, unspecified: Secondary | ICD-10-CM

## 2018-04-23 DIAGNOSIS — Z96641 Presence of right artificial hip joint: Secondary | ICD-10-CM

## 2018-04-23 DIAGNOSIS — Z87891 Personal history of nicotine dependence: Secondary | ICD-10-CM

## 2018-04-23 DIAGNOSIS — Z471 Aftercare following joint replacement surgery: Secondary | ICD-10-CM | POA: Diagnosis not present

## 2018-04-23 DIAGNOSIS — M1611 Unilateral primary osteoarthritis, right hip: Principal | ICD-10-CM | POA: Diagnosis present

## 2018-04-23 HISTORY — PX: TOTAL HIP ARTHROPLASTY: SHX124

## 2018-04-23 HISTORY — DX: Gastro-esophageal reflux disease without esophagitis: K21.9

## 2018-04-23 LAB — POTASSIUM: POTASSIUM: 3.8 mmol/L (ref 3.5–5.1)

## 2018-04-23 SURGERY — ARTHROPLASTY, HIP, TOTAL, ANTERIOR APPROACH
Anesthesia: Spinal | Laterality: Right

## 2018-04-23 MED ORDER — METOCLOPRAMIDE HCL 5 MG/ML IJ SOLN: 5.0000 mg | Freq: Three times a day (TID) | INTRAMUSCULAR | Status: DC | PRN

## 2018-04-23 MED ORDER — 0.9 % SODIUM CHLORIDE (POUR BTL) OPTIME
TOPICAL | Status: DC | PRN
  Administered 2018-04-23: 1000 mL

## 2018-04-23 MED ORDER — MENTHOL 3 MG MT LOZG: 1.0000 | LOZENGE | OROMUCOSAL | Status: DC | PRN

## 2018-04-23 MED ORDER — PRAVASTATIN SODIUM 10 MG PO TABS
20.0000 mg | ORAL_TABLET | Freq: Every day | ORAL | Status: DC
  Administered 2018-04-23 – 2018-04-24 (×2): 20 mg via ORAL
  Filled 2018-04-23 (×2): qty 2

## 2018-04-23 MED ORDER — OXYCODONE HCL 5 MG PO TABS
10.0000 mg | ORAL_TABLET | ORAL | Status: DC | PRN
  Administered 2018-04-24 – 2018-04-25 (×3): 10 mg via ORAL

## 2018-04-23 MED ORDER — FENTANYL CITRATE (PF) 100 MCG/2ML IJ SOLN: 25.0000 ug | INTRAMUSCULAR | Status: DC | PRN

## 2018-04-23 MED ORDER — PROPOFOL 500 MG/50ML IV EMUL
INTRAVENOUS | Status: DC | PRN
  Administered 2018-04-23: 100 ug/kg/min via INTRAVENOUS

## 2018-04-23 MED ORDER — ONDANSETRON HCL 4 MG/2ML IJ SOLN: 4.0000 mg | Freq: Four times a day (QID) | INTRAMUSCULAR | Status: DC | PRN

## 2018-04-23 MED ORDER — CEFAZOLIN SODIUM-DEXTROSE 2-4 GM/100ML-% IV SOLN
INTRAVENOUS | Status: AC
  Filled 2018-04-23: qty 100

## 2018-04-23 MED ORDER — PROPOFOL 1000 MG/100ML IV EMUL
INTRAVENOUS | Status: AC
  Filled 2018-04-23: qty 100

## 2018-04-23 MED ORDER — POLYETHYLENE GLYCOL 3350 17 G PO PACK
17.0000 g | PACK | Freq: Every day | ORAL | Status: DC | PRN
  Administered 2018-04-25: 17 g via ORAL
  Filled 2018-04-23: qty 1

## 2018-04-23 MED ORDER — METHOCARBAMOL 500 MG PO TABS
500.0000 mg | ORAL_TABLET | Freq: Four times a day (QID) | ORAL | Status: DC | PRN
  Administered 2018-04-25: 500 mg via ORAL
  Filled 2018-04-23: qty 1

## 2018-04-23 MED ORDER — OXYCODONE HCL 5 MG PO TABS
5.0000 mg | ORAL_TABLET | ORAL | Status: DC | PRN
  Administered 2018-04-23: 5 mg via ORAL
  Administered 2018-04-24 (×2): 10 mg via ORAL
  Filled 2018-04-23: qty 2
  Filled 2018-04-23: qty 1
  Filled 2018-04-23 (×4): qty 2

## 2018-04-23 MED ORDER — DOCUSATE SODIUM 100 MG PO CAPS
100.0000 mg | ORAL_CAPSULE | Freq: Two times a day (BID) | ORAL | Status: DC
  Administered 2018-04-23 – 2018-04-25 (×4): 100 mg via ORAL
  Filled 2018-04-23 (×4): qty 1

## 2018-04-23 MED ORDER — DEXAMETHASONE SODIUM PHOSPHATE 10 MG/ML IJ SOLN
INTRAMUSCULAR | Status: AC
  Filled 2018-04-23: qty 1

## 2018-04-23 MED ORDER — PANTOPRAZOLE SODIUM 40 MG PO TBEC
40.0000 mg | DELAYED_RELEASE_TABLET | Freq: Every day | ORAL | Status: DC
  Administered 2018-04-24 – 2018-04-25 (×2): 40 mg via ORAL
  Filled 2018-04-23 (×2): qty 1

## 2018-04-23 MED ORDER — SODIUM CHLORIDE 0.9 % IV SOLN
INTRAVENOUS | Status: DC | PRN
  Administered 2018-04-23: 30 ug/min via INTRAVENOUS

## 2018-04-23 MED ORDER — ZOLPIDEM TARTRATE 5 MG PO TABS: 5.0000 mg | ORAL_TABLET | Freq: Every evening | ORAL | Status: DC | PRN

## 2018-04-23 MED ORDER — ACETAMINOPHEN 325 MG PO TABS: 325.0000 mg | ORAL_TABLET | Freq: Four times a day (QID) | ORAL | Status: DC | PRN

## 2018-04-23 MED ORDER — SODIUM CHLORIDE 0.9 % IV SOLN
INTRAVENOUS | Status: DC
  Administered 2018-04-23: 15:00:00 via INTRAVENOUS

## 2018-04-23 MED ORDER — ASPIRIN 81 MG PO CHEW
81.0000 mg | CHEWABLE_TABLET | Freq: Two times a day (BID) | ORAL | Status: DC
  Administered 2018-04-23 – 2018-04-25 (×4): 81 mg via ORAL
  Filled 2018-04-23 (×4): qty 1

## 2018-04-23 MED ORDER — METOCLOPRAMIDE HCL 5 MG PO TABS: 5.0000 mg | ORAL_TABLET | Freq: Three times a day (TID) | ORAL | Status: DC | PRN

## 2018-04-23 MED ORDER — CEFAZOLIN SODIUM-DEXTROSE 2-4 GM/100ML-% IV SOLN
2.0000 g | INTRAVENOUS | Status: AC
  Administered 2018-04-23: 2 g via INTRAVENOUS

## 2018-04-23 MED ORDER — CEFAZOLIN SODIUM-DEXTROSE 1-4 GM/50ML-% IV SOLN
1.0000 g | Freq: Four times a day (QID) | INTRAVENOUS | Status: AC
  Administered 2018-04-23 (×2): 1 g via INTRAVENOUS
  Filled 2018-04-23 (×2): qty 50

## 2018-04-23 MED ORDER — SODIUM CHLORIDE 0.9 % IR SOLN
Status: DC | PRN
  Administered 2018-04-23: 3000 mL

## 2018-04-23 MED ORDER — OXYCODONE HCL 5 MG PO TABS: 5.0000 mg | ORAL_TABLET | Freq: Once | ORAL | Status: DC | PRN

## 2018-04-23 MED ORDER — PROPOFOL 10 MG/ML IV BOLUS
INTRAVENOUS | Status: AC
  Filled 2018-04-23: qty 20

## 2018-04-23 MED ORDER — FENTANYL CITRATE (PF) 250 MCG/5ML IJ SOLN
INTRAMUSCULAR | Status: AC
  Filled 2018-04-23: qty 5

## 2018-04-23 MED ORDER — DIPHENHYDRAMINE HCL 12.5 MG/5ML PO ELIX: 12.5000 mg | ORAL_SOLUTION | ORAL | Status: DC | PRN

## 2018-04-23 MED ORDER — ONDANSETRON HCL 4 MG/2ML IJ SOLN: 4.0000 mg | Freq: Once | INTRAMUSCULAR | Status: DC | PRN

## 2018-04-23 MED ORDER — OXYCODONE HCL 5 MG/5ML PO SOLN: 5.0000 mg | Freq: Once | ORAL | Status: DC | PRN

## 2018-04-23 MED ORDER — AMLODIPINE BESYLATE 5 MG PO TABS
5.0000 mg | ORAL_TABLET | Freq: Every day | ORAL | Status: DC
  Administered 2018-04-23 – 2018-04-24 (×3): 5 mg via ORAL
  Filled 2018-04-23 (×2): qty 1

## 2018-04-23 MED ORDER — BUPIVACAINE IN DEXTROSE 0.75-8.25 % IT SOLN
INTRATHECAL | Status: DC | PRN
  Administered 2018-04-23: 2 mL via INTRATHECAL

## 2018-04-23 MED ORDER — ONDANSETRON HCL 4 MG/2ML IJ SOLN
INTRAMUSCULAR | Status: DC | PRN
  Administered 2018-04-23: 4 mg via INTRAVENOUS

## 2018-04-23 MED ORDER — PHENOL 1.4 % MT LIQD: 1.0000 | OROMUCOSAL | Status: DC | PRN

## 2018-04-23 MED ORDER — ALUM & MAG HYDROXIDE-SIMETH 200-200-20 MG/5ML PO SUSP: 30.0000 mL | ORAL | Status: DC | PRN

## 2018-04-23 MED ORDER — ONDANSETRON HCL 4 MG PO TABS: 4.0000 mg | ORAL_TABLET | Freq: Four times a day (QID) | ORAL | Status: DC | PRN

## 2018-04-23 MED ORDER — ONDANSETRON HCL 4 MG/2ML IJ SOLN
INTRAMUSCULAR | Status: AC
  Filled 2018-04-23: qty 2

## 2018-04-23 MED ORDER — FENTANYL CITRATE (PF) 100 MCG/2ML IJ SOLN
INTRAMUSCULAR | Status: DC | PRN
  Administered 2018-04-23: 25 ug via INTRAVENOUS

## 2018-04-23 MED ORDER — LACTATED RINGERS IV SOLN
INTRAVENOUS | Status: DC | PRN
  Administered 2018-04-23: 07:00:00 via INTRAVENOUS

## 2018-04-23 MED ORDER — DEXAMETHASONE SODIUM PHOSPHATE 10 MG/ML IJ SOLN
INTRAMUSCULAR | Status: DC | PRN
  Administered 2018-04-23: 10 mg via INTRAVENOUS

## 2018-04-23 MED ORDER — HYDROMORPHONE HCL 1 MG/ML IJ SOLN: 0.5000 mg | INTRAMUSCULAR | Status: DC | PRN

## 2018-04-23 MED ORDER — PROPOFOL 10 MG/ML IV BOLUS
INTRAVENOUS | Status: DC | PRN
  Administered 2018-04-23: 30 mg via INTRAVENOUS
  Administered 2018-04-23 (×2): 20 mg via INTRAVENOUS
  Administered 2018-04-23: 10 mg via INTRAVENOUS
  Administered 2018-04-23: 30 mg via INTRAVENOUS
  Administered 2018-04-23 (×2): 20 mg via INTRAVENOUS

## 2018-04-23 MED ORDER — METHOCARBAMOL 1000 MG/10ML IJ SOLN
500.0000 mg | Freq: Four times a day (QID) | INTRAVENOUS | Status: DC | PRN
  Filled 2018-04-23: qty 5

## 2018-04-23 SURGICAL SUPPLY — 47 items
BLADE CLIPPER SURG (BLADE) IMPLANT
BLADE SAW SGTL 18X1.27X75 (BLADE) ×2 IMPLANT
BLADE SAW SGTL 18X1.27X75MM (BLADE) ×1
COVER SURGICAL LIGHT HANDLE (MISCELLANEOUS) ×3 IMPLANT
CUP ACET PNNCL SECTR W/GRIP 56 (Hips) ×1 IMPLANT
DRAPE C-ARM 42X72 X-RAY (DRAPES) ×3 IMPLANT
DRAPE STERI IOBAN 125X83 (DRAPES) ×3 IMPLANT
DRAPE U-SHAPE 47X51 STRL (DRAPES) ×9 IMPLANT
DRSG AQUACEL AG ADV 3.5X10 (GAUZE/BANDAGES/DRESSINGS) ×3 IMPLANT
DURAPREP 26ML APPLICATOR (WOUND CARE) ×3 IMPLANT
ELECT BLADE 4.0 EZ CLEAN MEGAD (MISCELLANEOUS) ×3
ELECT BLADE 6.5 EXT (BLADE) IMPLANT
ELECT REM PT RETURN 9FT ADLT (ELECTROSURGICAL) ×3
ELECTRODE BLDE 4.0 EZ CLN MEGD (MISCELLANEOUS) ×1 IMPLANT
ELECTRODE REM PT RTRN 9FT ADLT (ELECTROSURGICAL) ×1 IMPLANT
FACESHIELD WRAPAROUND (MASK) ×9 IMPLANT
GLOVE BIOGEL PI IND STRL 8 (GLOVE) ×2 IMPLANT
GLOVE BIOGEL PI INDICATOR 8 (GLOVE) ×4
GLOVE ECLIPSE 8.0 STRL XLNG CF (GLOVE) ×3 IMPLANT
GLOVE ORTHO TXT STRL SZ7.5 (GLOVE) ×6 IMPLANT
GOWN STRL REUS W/ TWL LRG LVL3 (GOWN DISPOSABLE) ×2 IMPLANT
GOWN STRL REUS W/ TWL XL LVL3 (GOWN DISPOSABLE) ×2 IMPLANT
GOWN STRL REUS W/TWL LRG LVL3 (GOWN DISPOSABLE) ×6
GOWN STRL REUS W/TWL XL LVL3 (GOWN DISPOSABLE) ×6
HANDPIECE INTERPULSE COAX TIP (DISPOSABLE) ×3
HEAD CERAMIC 36 PLUS 8.5 12 14 (Hips) ×3 IMPLANT
KIT BASIN OR (CUSTOM PROCEDURE TRAY) ×3 IMPLANT
KIT TURNOVER KIT B (KITS) ×3 IMPLANT
MANIFOLD NEPTUNE II (INSTRUMENTS) ×3 IMPLANT
NS IRRIG 1000ML POUR BTL (IV SOLUTION) ×3 IMPLANT
PACK TOTAL JOINT (CUSTOM PROCEDURE TRAY) ×3 IMPLANT
PAD ARMBOARD 7.5X6 YLW CONV (MISCELLANEOUS) ×3 IMPLANT
PINN SECTOR W/GRIP ACE CUP 56 (Hips) ×3 IMPLANT
PINNACLE ALTRX PLUS 4 N 36X56 (Hips) ×3 IMPLANT
SET HNDPC FAN SPRY TIP SCT (DISPOSABLE) ×1 IMPLANT
STEM CORAIL KA14 (Stem) ×3 IMPLANT
SUT ETHIBOND NAB CT1 #1 30IN (SUTURE) ×6 IMPLANT
SUT MNCRL AB 4-0 PS2 18 (SUTURE) IMPLANT
SUT VIC AB 0 CT1 27 (SUTURE) ×6
SUT VIC AB 0 CT1 27XBRD ANBCTR (SUTURE) ×2 IMPLANT
SUT VIC AB 1 CT1 27 (SUTURE) ×6
SUT VIC AB 1 CT1 27XBRD ANBCTR (SUTURE) ×2 IMPLANT
SUT VIC AB 2-0 CT1 27 (SUTURE) ×6
SUT VIC AB 2-0 CT1 TAPERPNT 27 (SUTURE) ×2 IMPLANT
TOWEL OR 17X24 6PK STRL BLUE (TOWEL DISPOSABLE) ×3 IMPLANT
TOWEL OR 17X26 10 PK STRL BLUE (TOWEL DISPOSABLE) ×3 IMPLANT
TRAY CATH 16FR W/PLASTIC CATH (SET/KITS/TRAYS/PACK) IMPLANT

## 2018-04-23 NOTE — H&P (Signed)
TOTAL HIP ADMISSION H&P  Patient is admitted for right total hip arthroplasty.  Subjective:  Chief Complaint: right hip pain  HPI: Wyatt Wilson, 67 y.o. male, has a history of pain and functional disability in the right hip(s) due to arthritis and patient has failed non-surgical conservative treatments for greater than 12 weeks to include NSAID's and/or analgesics, flexibility and strengthening excercises, use of assistive devices, weight reduction as appropriate and activity modification.  Onset of symptoms was gradual starting >10 years ago with gradually worsening course since that time.The patient noted no past surgery on the right hip(s).  Patient currently rates pain in the right hip at 10 out of 10 with activity. Patient has night pain, worsening of pain with activity and weight bearing, trendelenberg gait, pain that interfers with activities of daily living and pain with passive range of motion. Patient has evidence of subchondral cysts, subchondral sclerosis, periarticular osteophytes and joint space narrowing by imaging studies. This condition presents safety issues increasing the risk of falls.  There is no current active infection.  Patient Active Problem List   Diagnosis Date Noted  . Primary osteoarthritis of right hip 12/12/2017  . History of colonic polyps 04/19/2016  . Constipation 04/19/2016  . HIP, ARTHRITIS, DEGEN./OSTEO 07/14/2009   Past Medical History:  Diagnosis Date  . Arthritis   . Hypercholesterolemia   . Hypertension     Past Surgical History:  Procedure Laterality Date  . COLONOSCOPY  07/19/2011   Procedure: COLONOSCOPY;  Surgeon: Daneil Dolin, MD;  Location: [facility];  Service: Endoscopy;  Laterality: N/A;  9:00 AM; RESULTS: Mix of tubular adenoma and hyperplastic polyps.  . COLONOSCOPY N/A 04/24/2016   Procedure: COLONOSCOPY;  Surgeon: Daneil Dolin, MD;  Location: [facility];  Service: Endoscopy;  Laterality: N/A;  245  . CYSTECTOMY  1988   Cyst removed from back    Current Facility-Administered Medications  Medication Dose Route Frequency Provider Last Rate Last Dose  . ceFAZolin (ANCEF) 2-4 GM/100ML-% IVPB           . ceFAZolin (ANCEF) IVPB 2g/100 mL premix  2 g Intravenous On Call to [place], MD      . tranexamic acid (CYKLOKAPRON) IVPB 1,000 mg  1,000 mg Intravenous To OR Mcarthur Rossetti, MD       No Known Allergies  Social History   Tobacco Use  . Smoking status: Former Smoker    Packs/day: 1.50    Years: 30.00    Pack years: 45.00    Types: Cigarettes    Last attempt to quit: 03/27/2013    Years since quitting: 5.0  . Smokeless tobacco: Never Used  Substance Use Topics  . Alcohol use: Yes    Comment: Rarely (once every several months)    Family History  Problem Relation Age of Onset  . Alzheimer's disease Father   . Colon cancer Neg Hx   . Colon polyps Neg Hx      Review of Systems  Musculoskeletal: Positive for joint pain.  All other systems reviewed and are negative.   Objective:  Physical Exam  Constitutional: He is oriented to person, place, and time. He appears well-developed and well-nourished.  HENT:  Head: Normocephalic and atraumatic.  Eyes: Pupils are equal, round, and reactive to light. EOM are normal.  Neck: Normal range of motion. Neck supple.  Cardiovascular: Normal rate and regular rhythm.  Respiratory: Effort normal and breath sounds normal.  GI: Soft. Bowel sounds are  normal.  Musculoskeletal:       Right hip: He exhibits decreased range of motion, decreased strength, tenderness and bony tenderness.  Neurological: He is alert and oriented to person, place, and time.  Skin: Skin is warm and dry.  Psychiatric: He has a normal mood and affect.    Vital signs in last 24 hours: Temp:  [97.8 F (36.6 C)] 97.8 F (36.6 C) (12/10 0601) Pulse Rate:  [78] 78 (12/10 0601) Resp:  [20] 20 (12/10 0601) BP: (139)/(77) 139/77 (12/10 0601) SpO2:  [97 %] 97 %  (12/10 0601) Weight:  [109.8 kg] 109.8 kg (12/10 0601)  Labs:   Estimated body mass index is 35.74 kg/m as calculated from the following:   Height as of this encounter: 5\' 9"  (1.753 m).   Weight as of this encounter: 109.8 kg.   Imaging Review Plain radiographs demonstrate severe degenerative joint disease of the right hip(s). The bone quality appears to be good for age and reported activity level.    Preoperative templating of the joint replacement has been completed, documented, and submitted to the Operating Room personnel in order to optimize intra-operative equipment management.     Assessment/Plan:  End stage arthritis, right hip(s)  The patient history, physical examination, clinical judgement of the provider and imaging studies are consistent with end stage degenerative joint disease of the right hip(s) and total hip arthroplasty is deemed medically necessary. The treatment options including medical management, injection therapy, arthroscopy and arthroplasty were discussed at length. The risks and benefits of total hip arthroplasty were presented and reviewed. The risks due to aseptic loosening, infection, stiffness, dislocation/subluxation,  thromboembolic complications and other imponderables were discussed.  The patient acknowledged the explanation, agreed to proceed with the plan and consent was signed. Patient is being admitted for inpatient treatment for surgery, pain control, PT, OT, prophylactic antibiotics, VTE prophylaxis, progressive ambulation and ADL's and discharge planning.The patient is planning to be discharged home with home health services

## 2018-04-23 NOTE — Evaluation (Signed)
Physical Therapy Evaluation Patient Details Name: Wyatt Wilson MRN: [id number] DOB: Oct 22, 1950 Today's Date: 04/23/2018   History of Present Illness  Pt is a 67 y.o. male s/p R THA (direct anterior approach) on 04/23/18. PMH includes HTN, arthritis.  Clinical Impression  Pt presents with an overall decrease in functional mobility secondary to above. PTA, pt indep and lives alone; will have initial 24/7 supervision from brother upon return home. Educ on precautions, positioning, therex, and importance of mobility. Today, pt able to transfer and ambulate with RW and min guard for safety; pt moving well overall and motivated to return home. Pt would benefit from continued acute PT services to maximize functional mobility and independence prior to d/c home.     Follow Up Recommendations Follow surgeon's recommendation for DC plan and follow-up therapies;Supervision for mobility/OOB    Equipment Recommendations  Rolling walker with 5" wheels;3in1 (PT)    Recommendations for Other Services OT consult     Precautions / Restrictions Precautions Precautions: Fall Restrictions Weight Bearing Restrictions: Yes RLE Weight Bearing: Weight bearing as tolerated      Mobility  Bed Mobility Overal bed mobility: Modified Independent             General bed mobility comments: Increased time and effort; no physical assist required  Transfers Overall transfer level: Needs assistance Equipment used: Rolling walker (2 wheeled) Transfers: Sit to/from Stand Sit to Stand: Supervision         General transfer comment: Cues for correct hand placement  Ambulation/Gait Ambulation/Gait assistance: Min guard Gait Distance (Feet): 150 Feet Assistive device: Rolling walker (2 wheeled) Gait Pattern/deviations: Step-through pattern;Decreased stride length;Decreased weight shift to right Gait velocity: Decreased Gait velocity interpretation: 1.31 - 2.62 ft/sec, indicative of limited community  ambulator General Gait Details: Ambulatory with RW and min guard for balance; cues to slow down and maintain upright posture  Stairs            Wheelchair Mobility    Modified Rankin (Stroke Patients Only)       Balance Overall balance assessment: Needs assistance   Sitting balance-Leahy Scale: Good       Standing balance-Leahy Scale: Fair Standing balance comment: Can static stand without UE support                             Pertinent Vitals/Pain Pain Assessment: Faces Faces Pain Scale: Hurts a little bit Pain Location: R hip Pain Descriptors / Indicators: Sore Pain Intervention(s): Monitored during session    Home Living Family/patient expects to be discharged to:: Private residence Living Arrangements: Alone Available Help at Discharge: Family;Available 24 hours/day Type of Home: House Home Access: Stairs to enter   [employer] of Steps: 3 Home Layout: One level Home Equipment: Cane - single point Additional Comments: Brother plans to stay with pt at d/c    Prior Function Level of Independence: Independent               Hand Dominance        Extremity/Trunk Assessment   Upper Extremity Assessment Upper Extremity Assessment: Overall WFL for tasks assessed    Lower Extremity Assessment Lower Extremity Assessment: RLE deficits/detail RLE Deficits / Details: R hip flex >3/5, R knee flex/ext >3/5 RLE: Unable to fully assess due to pain       Communication   Communication: No difficulties  Cognition Arousal/Alertness: Awake/alert Behavior During Therapy: WFL for tasks assessed/performed Overall Cognitive Status: Within Functional  Limits for tasks assessed                                 General Comments: [facility] for basic tasks. But pt easily distracted and very talkative, requiring intermittent cues to focus on task/instructions      General Comments General comments (skin integrity, edema, etc.):  SpO2 >90% on RA    Exercises     Assessment/Plan    PT Assessment Patient needs continued PT services  PT Problem List Decreased strength;Decreased activity tolerance;Decreased balance;Decreased mobility;Decreased knowledge of use of DME;Decreased knowledge of precautions       PT Treatment Interventions DME instruction;Gait training;Stair training;Functional mobility training;Therapeutic activities;Therapeutic exercise;Balance training;Patient/family education    PT Goals (Current goals can be found in the Care Plan section)  Acute Rehab PT Goals Patient Stated Goal: Return home tomorrow if possible PT Goal Formulation: With patient Time For Goal Achievement: 05/07/18 Potential to Achieve Goals: Good    Frequency 7X/week   Barriers to discharge        Co-evaluation               AM-PAC PT "6 Clicks" Mobility  Outcome Measure Help needed turning from your back to your side while in a flat bed without using bedrails?: None Help needed moving from lying on your back to sitting on the side of a flat bed without using bedrails?: None Help needed moving to and from a bed to a chair (including a wheelchair)?: A Little Help needed standing up from a chair using your arms (e.g., wheelchair or bedside chair)?: A Little Help needed to walk in hospital room?: A Little Help needed climbing 3-5 steps with a railing? : A Little 6 Click Score: 20    End of Session Equipment Utilized During Treatment: Gait belt Activity Tolerance: Patient tolerated treatment well Patient left: in chair;with call bell/phone within reach Nurse Communication: Mobility status PT Visit Diagnosis: Other abnormalities of gait and mobility (R26.89)    Time: 1287-8676 PT Time Calculation (min) (ACUTE ONLY): 27 min   Charges:   PT Evaluation $PT Eval Moderate Complexity: 1 Mod PT Treatments $Gait Training: 8-22 mins      Mabeline Caras, PT, DPT Acute Rehabilitation Services  Pager  [phone] Office [place] 04/23/2018, 5:42 PM

## 2018-04-23 NOTE — Anesthesia Postprocedure Evaluation (Signed)
Anesthesia Post Note  Patient: Wyatt Wilson  Procedure(s) Performed: RIGHT TOTAL HIP ARTHROPLASTY ANTERIOR APPROACH (Right )     Patient location during evaluation: PACU Anesthesia Type: Spinal Level of consciousness: oriented and awake and alert Pain management: pain level controlled Vital Signs Assessment: post-procedure vital signs reviewed and stable Respiratory status: spontaneous breathing, respiratory function stable and nonlabored ventilation Cardiovascular status: blood pressure returned to baseline and stable Postop Assessment: no headache, no backache, no apparent nausea or vomiting and spinal receding Anesthetic complications: no    Last Vitals:  Vitals:   04/23/18 1315 04/23/18 1343  BP:  124/81  Pulse: 82 97  Resp: 18 18  Temp:  36.8 C  SpO2: 96% 94%    Last Pain:  Vitals:   04/23/18 1343  TempSrc: Oral  PainSc:                  Lidia Collum

## 2018-04-23 NOTE — Plan of Care (Signed)

## 2018-04-23 NOTE — Transfer of Care (Signed)
Immediate Anesthesia Transfer of Care Note  Patient: Wyatt Wilson  Procedure(s) Performed: RIGHT TOTAL HIP ARTHROPLASTY ANTERIOR APPROACH (Right )  Patient Location: PACU  Anesthesia Type:Spinal  Level of Consciousness: awake, alert  and oriented  Airway & Oxygen Therapy: Patient Spontanous Breathing and Patient connected to face mask oxygen  Post-op Assessment: Report given to RN and Post -op Vital signs reviewed and stable  Post vital signs: Reviewed and stable  Last Vitals:  Vitals Value Taken Time  BP 102/64 04/23/2018  8:53 AM  Temp    Pulse 31 04/23/2018  8:53 AM  Resp 19 04/23/2018  8:53 AM  SpO2 96 % 04/23/2018  8:53 AM  Vitals shown include unvalidated device data.  Last Pain:  Vitals:   04/23/18 0601  TempSrc: Oral  PainSc:       Patients Stated Pain Goal: 2 (12/87/86 7672)  Complications: No apparent anesthesia complications

## 2018-04-23 NOTE — Brief Op Note (Signed)
04/23/2018  8:37 AM  PATIENT:  Wyatt Wilson  67 y.o. male  PRE-OPERATIVE DIAGNOSIS:  right hip endstage osteoarthritis  POST-OPERATIVE DIAGNOSIS:  right hip endstage osteoarthritis  PROCEDURE:  Procedure(s): RIGHT TOTAL HIP ARTHROPLASTY ANTERIOR APPROACH (Right)  SURGEON:  Surgeon(s) and Role:    Mcarthur Rossetti, MD - Primary  PHYSICIAN ASSISTANT: Benita Stabile, PA-C  ANESTHESIA:   spinal  EBL:  400 mL   COUNTS:  YES  DICTATION: .Other Dictation: Dictation Number [id number]  PLAN OF CARE: Admit to inpatient   PATIENT DISPOSITION:  PACU - hemodynamically stable.   Delay start of Pharmacological VTE agent (>24hrs) due to surgical blood loss or risk of bleeding: no

## 2018-04-23 NOTE — Op Note (Signed)
[name], [name] MEDICAL RECORD [id number] ACCOUNT [id number] DATE OF BIRTH:Jun 19, 1950 FACILITY: [facility] LOCATION: [facility] PHYSICIAN:Mel Tadros [place], MD  OPERATIVE REPORT  DATE OF PROCEDURE:  04/23/2018  PREOPERATIVE DIAGNOSIS:  Severe primary osteoarthritis and degenerative joint disease, right hip.  POSTOPERATIVE DIAGNOSIS:  Severe primary osteoarthritis and degenerative joint disease, right hip.  PROCEDURE:  Right total hip arthroplasty, direct anterior approach.  IMPLANTS:  DePuy Sector Gription acetabular component size 56, size 36+4 neutral polyethylene liner, size 14 Corail femoral component with standard offset, size 36+8.5 ceramic hip ball.  SURGEON:  Lind Guest. Ninfa Linden, MD  ASSISTANT:  Erskine Emery, PA-C.  ANESTHESIA:  Spinal.  ANTIBIOTICS:  Two grams IV Ancef.  ESTIMATED BLOOD LOSS:  629 mL  COMPLICATIONS:  None.  INDICATIONS:  The patient is a very pleasant 67 year old gentleman with over a 10-year history of worsening right hip pain.  He is a very active individual.  His x-rays show significant loss of joint space of the right hip.  He is actually shorter on  that side when he walks comparing his right and left legs.  His pain is daily and at this point he has tried and failed all forms of conservative treatment.  He understands that we recommended total hip arthroplasty.  We talked to him about the risk of  acute blood loss anemia, nerve or vessel injury, fracture, infection, dislocation, DVT and implant failure.  He understands our goals are to decrease pain, improve mobility and overall improve quality of life.  DESCRIPTION OF PROCEDURE:  After informed consent was obtained and appropriate right hip was marked.  He was brought to the operating room and sat up on the stretcher where spinal anesthesia was obtained.  He was then laid in the supine position on a  stretcher.  Foley catheter was placed.  I assessed his leg lengths and found them to  be significantly short on his right side.  We then placed traction boots on both his feet and placed him supine on the Hana fracture table, the perineal post in place  and both legs in line skeletal traction device and no traction applied.  His right operative hip was prepped and draped with DuraPrep and sterile drapes.  A time-out was called.  He was identified as correct patient, correct right hip.  I then made an  incision just distal and posterior to the anterior superior iliac spine and carried this obliquely down the leg.  We dissected down tensor fascia lata muscle.  Tensor fascia was then divided longitudinally to proceed with direct anterior approach to the  hip.  We identified and cauterized circumflex vessels.  I then identified the hip capsule, opened the hip capsule in an L-type format, finding moderate joint effusion and significant periarticular osteophytes around the femoral head and neck area.  We  placed a curved retractor.  Cobra retractor on the medial and lateral femoral neck and then made our femoral neck cut with an oscillating saw just proximal to the lesser trochanter and completed this with an osteotome.  We placed a corkscrew guide in the  femoral head and removed the femoral heads entirety and found it to be significantly devoid of cartilage.  I then placed a bent Hohmann over the medial acetabular rim and removed remnants of the acetabular labrum and other debris from the hip socket.   We then began reaming under direct visualization in stepwise increments from a size 44 reamer going all the way up to a size 55 with all  reamers under direct visualization, the last reamer under direct fluoroscopy, so we could obtain our depth with  reaming inclination and anteversion.  I then placed the real DePuy Sector Gription acetabular component size 56 and a 36+4 polyethylene liner for that size acetabular component.  Attention was then turned to the femur.  With the leg externally rotated  to  120 degrees, extended and adducted we were able to place a Mueller retractor medially and Hohman retractor behind the greater trochanter, released lateral joint capsule and used a box-cutting osteotome to enter femoral canal and a rongeur to lateralize  and then began broaching from size 8 broach using Corail broaching system going up to a size 14.  With a size 14 in place, we trialed a standard offset femoral neck and a 36+1.5 hip ball and reduced this in the acetabulum and it was stable.  He still  needed more offset and length based on intraoperative fluoroscopy.  We then dislocated the hip and removed the trial components.  I placed the real Corail femoral component with standard offset size 14.  We went with a real 36+8.5 ceramic hip ball.  We  reduced this in the acetabulum and it was definitely stable and tight and we have definitely increased his leg length as well.  This was assessed under direct visualization as well as assessment of range of motion and fluoroscopy.  We then irrigated the  soft tissue using normal saline solution using pulsatile lavage.  We closed the joint capsule with interrupted #1 Ethibond suture, followed by running #1 Vicryl and tensor fascia, 0 Vicryl in the deep tissue, 2-0 Vicryl subcutaneous tissue, 4-0 Monocryl  subcuticular stitch and Steri-Strips on the skin.  An Aquacel dressing was applied.  He was taken off the Hana table and taken to recovery room in stable condition.  All final counts were correct.  There were no complications noted.  Of note, Benita Stabile,  PA-C, assisted the entire case.  Assistance was crucial for facilitating all aspects of this case.  TN/NUANCE  D:04/23/2018 T:04/23/2018 JOB:[id number]

## 2018-04-23 NOTE — Anesthesia Procedure Notes (Signed)

## 2018-04-24 ENCOUNTER — Encounter (HOSPITAL_COMMUNITY): Payer: Self-pay | Admitting: Orthopaedic Surgery

## 2018-04-24 LAB — CBC
HCT: 34 % — ABNORMAL LOW (ref 39.0–52.0)
Hemoglobin: 10.7 g/dL — ABNORMAL LOW (ref 13.0–17.0)
MCH: 28.2 pg (ref 26.0–34.0)
MCHC: 31.5 g/dL (ref 30.0–36.0)
MCV: 89.7 fL (ref 80.0–100.0)
PLATELETS: 241 10*3/uL (ref 150–400)
RBC: 3.79 MIL/uL — ABNORMAL LOW (ref 4.22–5.81)
RDW: 13.3 % (ref 11.5–15.5)
WBC: 10.7 10*3/uL — ABNORMAL HIGH (ref 4.0–10.5)
nRBC: 0 % (ref 0.0–0.2)

## 2018-04-24 LAB — BASIC METABOLIC PANEL
Anion gap: 6 (ref 5–15)
BUN: 22 mg/dL (ref 8–23)
CO2: 30 mmol/L (ref 22–32)
Calcium: 8.6 mg/dL — ABNORMAL LOW (ref 8.9–10.3)
Chloride: 106 mmol/L (ref 98–111)
Creatinine, Ser: 1.02 mg/dL (ref 0.61–1.24)
GFR calc Af Amer: >60 mL/min (ref >60)
GFR calc non Af Amer: >60 mL/min (ref >60)
Glucose, Bld: 126 mg/dL — ABNORMAL HIGH (ref 70–99)
Potassium: 3.6 mmol/L (ref 3.5–5.1)
SODIUM: 142 mmol/L (ref 135–145)

## 2018-04-24 NOTE — Progress Notes (Signed)
Patient ID: Wyatt Wilson, male   DOB: 1951-04-11, 67 y.o.   MRN: [id number] The patient would rather go home with home health than skilled nursing.  He understands that we need to see how he is with his mobility working with therapy today.

## 2018-04-24 NOTE — Plan of Care (Signed)
  Problem: Clinical Measurements: Goal: Will remain free from infection Outcome: Progressing Note:  Pt has shown no signs of infection during my care.    Problem: Activity: Goal: Risk for activity intolerance will decrease Outcome: Progressing Note:  Pt has been OOB and ambulating with therapy during my care.    Problem: Pain Managment: Goal: General experience of comfort will improve Outcome: Progressing Note:  Pt has requested pain medication twice during shift, each time to coincide with therapy.   Problem: Safety: Goal: Ability to remain free from injury will improve Outcome: Progressing Note:  Pt has remained safe and free from falls during my care.

## 2018-04-24 NOTE — Progress Notes (Signed)
Subjective: 1 Day Post-Op Procedure(s) (LRB): RIGHT TOTAL HIP ARTHROPLASTY ANTERIOR APPROACH (Right) Patient reports pain as moderate.    Objective: Vital signs in last 24 hours: Temp:  [97.3 F (36.3 C)-99.7 F (37.6 C)] 98.7 F (37.1 C) (12/11 0425) Pulse Rate:  [50-97] 84 (12/11 0425) Resp:  [13-21] 16 (12/11 0425) BP: (94-147)/(54-92) 115/77 (12/11 0425) SpO2:  [92 %-97 %] 95 % (12/11 0425)  Intake/Output from previous day: 12/10 0701 - 12/11 0700 In: 1600.8 [I.V.:1600.8] Out: 2550 [Urine:2150; Blood:400] Intake/Output this shift: No intake/output data recorded.  Recent Labs    04/24/18 0212  HGB 10.7*   Recent Labs    04/24/18 0212  WBC 10.7*  RBC 3.79*  HCT 34.0*  PLT 241   Recent Labs    04/23/18 0618 04/24/18 0212  NA  --  142  K 3.8 3.6  CL  --  106  CO2  --  30  BUN  --  22  CREATININE  --  1.02  GLUCOSE  --  126*  CALCIUM  --  8.6*   No results for input(s): LABPT, INR in the last 72 hours.  Sensation intact distally Intact pulses distally Dorsiflexion/Plantar flexion intact Incision: dressing C/D/I  Assessment/Plan: 1 Day Post-Op Procedure(s) (LRB): RIGHT TOTAL HIP ARTHROPLASTY ANTERIOR APPROACH (Right) Up with therapy Discharge to SNF by Friday.    Mcarthur Rossetti 04/24/2018, 7:45 AM

## 2018-04-24 NOTE — Progress Notes (Signed)
Physical Therapy Treatment Patient Details Name: Wyatt Wilson MRN: [id number] DOB: [birth year] Today's Date: 04/24/2018    History of Present Illness Pt is a 67 y.o. male s/p R THA (direct anterior approach) on 04/23/18. PMH includes HTN, arthritis.    PT Comments    Patient continues to make progress toward PT goals and tolerated session well. Current plan remains appropriate.    Follow Up Recommendations  Follow surgeon's recommendation for DC plan and follow-up therapies;Supervision for mobility/OOB     Equipment Recommendations  Rolling walker with 5" wheels;3in1 (PT)    Recommendations for Other Services OT consult     Precautions / Restrictions Precautions Precautions: Fall Restrictions Weight Bearing Restrictions: Yes RLE Weight Bearing: Weight bearing as tolerated    Mobility  Bed Mobility Overal bed mobility: Modified Independent Bed Mobility: Supine to Sit;Sit to Supine     Supine to sit: Modified independent (Device/Increase time) Sit to supine: Min guard   General bed mobility comments: Increased time and effort; no physical assist required  Transfers Overall transfer level: Needs assistance Equipment used: Rolling walker (2 wheeled) Transfers: Sit to/from Stand Sit to Stand: Supervision         General transfer comment: supervision for safety  Ambulation/Gait Ambulation/Gait assistance: Min guard;Supervision Gait Distance (Feet): 500 Feet Assistive device: Rolling walker (2 wheeled) Gait Pattern/deviations: Step-through pattern;Decreased weight shift to right;Decreased stride length;Decreased stance time - right;Decreased step length - left     General Gait Details: cues for posture, increased L step length adn step length symmetry; improved gait mechanics and weight bearing with increased mobility   Stairs             Wheelchair Mobility    Modified Rankin (Stroke Patients Only)       Balance Overall balance assessment:  Needs assistance   Sitting balance-Leahy Scale: Good       Standing balance-Leahy Scale: Fair Standing balance comment: Can static stand without UE support                            Cognition Arousal/Alertness: Awake/alert Behavior During Therapy: WFL for tasks assessed/performed Overall Cognitive Status: Within Functional Limits for tasks assessed                                 General Comments: WFL for basic tasks. But pt easily distracted and very talkative, requiring intermittent cues to focus on task/instructions      Exercises Total Joint Exercises Quad Sets: Both;10 reps;AROM;Strengthening Short Arc Quad: Right;10 reps;AROM;Strengthening Heel Slides: Right;10 reps;Strengthening;AROM Hip ABduction/ADduction: AROM;Strengthening;Right;10 reps Marching in Standing: Right;Strengthening;5 reps;Standing    General Comments        Pertinent Vitals/Pain Pain Assessment: Faces Faces Pain Scale: Hurts little more Pain Location: R hip Pain Descriptors / Indicators: Sore;Tightness Pain Intervention(s): Limited activity within patient's tolerance;Monitored during session;Premedicated before session;Repositioned    Home Living Family/patient expects to be discharged to:: Private residence Living Arrangements: Alone Available Help at Discharge: Family;Available 24 hours/day(brother plans on staying with him) Type of Home: House Home Access: Stairs to enter   Home Layout: One level Home Equipment: [place]andra [place] - single point Additional Comments: Brother plans to stay with pt at d/c    Prior Function Level of Independence: Independent          PT Goals (current goals can now be found in the care plan section) Acute Rehab  PT Goals Patient Stated Goal: get back to walking without pain Progress towards PT goals: Progressing toward goals    Frequency    7X/week      PT Plan Current plan remains appropriate    Co-evaluation               AM-PAC PT "6 Clicks" Mobility   Outcome Measure  Help needed turning from your back to your side while in a flat bed without using bedrails?: None Help needed moving from lying on your back to sitting on the side of a flat bed without using bedrails?: None Help needed moving to and from a bed to a chair (including a wheelchair)?: A Little Help needed standing up from a chair using your arms (e.g., wheelchair or bedside chair)?: A Little Help needed to walk in hospital room?: A Little Help needed climbing 3-5 steps with a railing? : A Little 6 Click Score: 20    End of Session Equipment Utilized During Treatment: Gait belt Activity Tolerance: Patient tolerated treatment well Patient left: with call bell/phone within reach;in bed Nurse Communication: Mobility status PT Visit Diagnosis: Other abnormalities of gait and mobility (R26.89)     Time: 1500-1530 PT Time Calculation (min) (ACUTE ONLY): 30 min  Charges:  $Gait Training: 8-22 mins $Therapeutic Exercise: 8-22 mins                     Earney Navy, PTA Acute Rehabilitation Services Pager: [phone] Office: [phone]     Darliss Cheney 04/24/2018, 4:54 PM

## 2018-04-24 NOTE — Progress Notes (Signed)
Physical Therapy Treatment Patient Details Name: Wyatt Wilson MRN: [id number] DOB: October 26, 1950 Today's Date: 04/24/2018    History of Present Illness Pt is a 67 y.o. male s/p R THA (direct anterior approach) on 04/23/18. PMH includes HTN, arthritis.    PT Comments    Patient seen for mobility progression. Pt is making progress toward PT goals and tolerated gait/stair training well with c/o 3/10 pain. Pt overall requires supervision/min guard for safety with OOB mobility due to some impulsivity and decreased safety awareness.  Continue to progress as tolerated.    Follow Up Recommendations  Follow surgeon's recommendation for DC plan and follow-up therapies;Supervision for mobility/OOB     Equipment Recommendations  Rolling walker with 5" wheels;3in1 (PT)    Recommendations for Other Services OT consult     Precautions / Restrictions Precautions Precautions: Fall Restrictions Weight Bearing Restrictions: Yes RLE Weight Bearing: Weight bearing as tolerated    Mobility  Bed Mobility Overal bed mobility: Modified Independent             General bed mobility comments: Increased time and effort; no physical assist required  Transfers Overall transfer level: Needs assistance Equipment used: Rolling walker (2 wheeled) Transfers: Sit to/from Stand Sit to Stand: Supervision         General transfer comment: supervision for safety; cues for safe hand placement initial stand   Ambulation/Gait Ambulation/Gait assistance: Min guard Gait Distance (Feet): 400 Feet Assistive device: Rolling walker (2 wheeled) Gait Pattern/deviations: Step-through pattern;Decreased weight shift to right;Decreased stride length;Decreased stance time - right;Decreased step length - left Gait velocity: Decreased   General Gait Details: cues for posture, proximity to RW, and step length symmetry; pt with improved step through pattern and weight bearing with increased distance     Stairs Stairs: Yes Stairs assistance: Min guard Stair Management: One rail Right;Step to pattern;Sideways Number of Stairs: (2 steps X 2 trials ) General stair comments: cues for sequencing and technique   Wheelchair Mobility    Modified Rankin (Stroke Patients Only)       Balance Overall balance assessment: Needs assistance   Sitting balance-Leahy Scale: Good       Standing balance-Leahy Scale: Fair Standing balance comment: Can static stand without UE support                            Cognition Arousal/Alertness: Awake/alert Behavior During Therapy: WFL for tasks assessed/performed Overall Cognitive Status: Within Functional Limits for tasks assessed                                 General Comments: WFL for basic tasks. But pt easily distracted and very talkative, requiring intermittent cues to focus on task/instructions      Exercises Total Joint Exercises Long Arc Quad: Strengthening;Right;10 reps;Seated Marching in Standing: Right;Strengthening;5 reps;Standing Standing Hip Extension: Right;AROM;5 reps;Standing    General Comments        Pertinent Vitals/Pain Pain Assessment: 0-10 Pain Score: 3  Pain Location: R hip Pain Descriptors / Indicators: Sore;Tightness Pain Intervention(s): Monitored during session;Repositioned;RN gave pain meds during session;Ice applied    Home Living                      Prior Function            PT Goals (current goals can now be found in the care plan section) Progress  towards PT goals: Progressing toward goals    Frequency    7X/week      PT Plan Current plan remains appropriate    Co-evaluation              AM-PAC PT "6 Clicks" Mobility   Outcome Measure  Help needed turning from your back to your side while in a flat bed without using bedrails?: None Help needed moving from lying on your back to sitting on the side of a flat bed without using bedrails?:  None Help needed moving to and from a bed to a chair (including a wheelchair)?: A Little Help needed standing up from a chair using your arms (e.g., wheelchair or bedside chair)?: A Little Help needed to walk in hospital room?: A Little Help needed climbing 3-5 steps with a railing? : A Little 6 Click Score: 20    End of Session Equipment Utilized During Treatment: Gait belt Activity Tolerance: Patient tolerated treatment well Patient left: with call bell/phone within reach;in bed Nurse Communication: Mobility status PT Visit Diagnosis: Other abnormalities of gait and mobility (R26.89)     Time: 4650-3546 PT Time Calculation (min) (ACUTE ONLY): 32 min  Charges:  $Gait Training: 23-37 mins                     Earney Navy, PTA Acute Rehabilitation Services Pager: [phone] Office: [phone]     Darliss Cheney 04/24/2018, 10:01 AM

## 2018-04-24 NOTE — Evaluation (Signed)
Occupational Therapy Evaluation Patient Details Name: Wyatt Wilson MRN: [id number] DOB: Oct 13, 1950 Today's Date: 04/24/2018    History of Present Illness Pt is a 67 y.o. male s/p R THA (direct anterior approach) on 04/23/18. PMH includes HTN, arthritis.   Clinical Impression   PTA Pt independent in ADL and mobility. Pt is currently min guard/supervision for transfers with safety for cues with RW. Pt requires assitance for knee down to foot bathing/dressing with RLE. Educated Pt on shower safety/options as his shower is very small and will not fit DME. Verbally educated Pt on 3 in 1 as tub chair for shower. He will benefit from skilled OT in the acute setting to maximize safety and independence in ADL and functional transfers. Next session take AE bag for LB ADL.    Follow Up Recommendations  No OT follow up;Supervision - Intermittent    Equipment Recommendations  3 in 1 bedside commode    Recommendations for Other Services       Precautions / Restrictions Precautions Precautions: Fall Restrictions Weight Bearing Restrictions: Yes RLE Weight Bearing: Weight bearing as tolerated      Mobility Bed Mobility Overal bed mobility: Needs Assistance Bed Mobility: Supine to Sit;Sit to Supine     Supine to sit: Modified independent (Device/Increase time) Sit to supine: Min guard   General bed mobility comments: Increased time and effort; no physical assist required  Transfers Overall transfer level: Needs assistance Equipment used: Rolling walker (2 wheeled) Transfers: Sit to/from Stand Sit to Stand: Supervision         General transfer comment: supervision for safety; cues for safe hand placement initial stand     Balance Overall balance assessment: Needs assistance   Sitting balance-Leahy Scale: Good       Standing balance-Leahy Scale: Fair Standing balance comment: Can static stand without UE support                           ADL either performed  or assessed with clinical judgement   ADL Overall ADL's : Needs assistance/impaired Eating/Feeding: Independent   Grooming: Wash/dry hands;Wash/dry face;Min guard;Standing Grooming Details (indicate cue type and reason): sink level Upper Body Bathing: Min guard;Standing   Lower Body Bathing: Minimal assistance;Sit to/from stand Lower Body Bathing Details (indicate cue type and reason): unable to access knee down to feet  Upper Body Dressing : Independent   Lower Body Dressing: Moderate assistance;Sit to/from stand Lower Body Dressing Details (indicate cue type and reason): able to don/doff socks on L foot, but not right. Toilet Transfer: Supervision/safety;Ambulation;RW;Cueing for safety   Toileting- Clothing Manipulation and Hygiene: Supervision/safety;Sitting/lateral lean   Tub/ Shower Transfer: Walk-in shower;Min guard;Ambulation;Rolling walker Tub/Shower Transfer Details (indicate cue type and reason): Shower is very small, unable to use seat in there. Educated Pt on 3 in 1 for tub as well and to have caregiver supervision Functional mobility during ADLs: Supervision/safety;Rolling walker;Cueing for safety General ADL Comments: Pt overall doing very well, decreased ROM and access RLE for dressing/bathing and will plan on continued edcuation for AE     Vision Baseline Vision/History: Wears glasses Wears Glasses: At all times Patient Visual Report: No change from baseline       Perception     Praxis      Pertinent Vitals/Pain Pain Assessment: Faces Faces Pain Scale: Hurts a little bit Pain Location: R hip Pain Descriptors / Indicators: Sore;Tightness Pain Intervention(s): Monitored during session;Repositioned;Patient requesting pain meds-RN notified;Ice applied  Hand Dominance     Extremity/Trunk Assessment Upper Extremity Assessment Upper Extremity Assessment: Overall WFL for tasks assessed   Lower Extremity Assessment Lower Extremity Assessment: Defer to PT  evaluation   Cervical / Trunk Assessment Cervical / Trunk Assessment: Normal   Communication Communication Communication: No difficulties   Cognition Arousal/Alertness: Awake/alert Behavior During Therapy: WFL for tasks assessed/performed Overall Cognitive Status: Within Functional Limits for tasks assessed                                 General Comments: WFL for basic tasks. But pt easily distracted and very talkative, requiring intermittent cues to focus on task/instructions   General Comments       Exercises     Shoulder Instructions      Home Living Family/patient expects to be discharged to:: Private residence Living Arrangements: Alone Available Help at Discharge: Family;Available 24 hours/day(brother plans on staying with him) Type of Home: House Home Access: Stairs to enter [employer] of Steps: 3   Home Layout: One level     Bathroom Shower/Tub: Walk-in shower;Tub/shower unit   Armed forces training and education officer: Yes How Accessible: Accessible via walker Home Equipment: [place] - single point   Additional Comments: Brother plans to stay with pt at d/c      Prior Functioning/Environment Level of Independence: Independent                 OT Problem List: Decreased range of motion;Impaired balance (sitting and/or standing);Decreased activity tolerance;Decreased knowledge of use of DME or AE;Pain      OT Treatment/Interventions: Self-care/ADL training;DME and/or AE instruction;Therapeutic activities;Patient/family education;Balance training    OT Goals(Current goals can be found in the care plan section) Acute Rehab OT Goals Patient Stated Goal: get back to walking without pain OT Goal Formulation: With patient Time For Goal Achievement: 05/08/18 Potential to Achieve Goals: Good ADL Goals Pt Will Perform Grooming: with modified independence;standing Pt Will Perform Lower Body Bathing: with modified  independence;with adaptive equipment;sit to/from stand Pt Will Perform Lower Body Dressing: with modified independence;sit to/from stand;with adaptive equipment Pt Will Transfer to Toilet: with modified independence;ambulating Pt Will Perform Toileting - Clothing Manipulation and hygiene: with modified independence;sit to/from stand  OT Frequency: Min 2X/week   Barriers to D/C:            Co-evaluation              AM-PAC OT "6 Clicks" Daily Activity     Outcome Measure Help from another person eating meals?: None Help from another person taking care of personal grooming?: A Little Help from another person toileting, which includes using toliet, bedpan, or urinal?: A Little Help from another person bathing (including washing, rinsing, drying)?: A Lot Help from another person to put on and taking off regular upper body clothing?: None Help from another person to put on and taking off regular lower body clothing?: A Lot 6 Click Score: 18   End of Session Equipment Utilized During Treatment: Gait belt;Rolling walker Nurse Communication: Mobility status  Activity Tolerance: Patient tolerated treatment well Patient left: in bed;with call bell/phone within reach  OT Visit Diagnosis: Other abnormalities of gait and mobility (R26.89);Pain Pain - Right/Left: Right Pain - part of body: Hip                Time: 3545-6256 OT Time Calculation (min): 22 min Charges:  OT General Charges $OT Visit: 1  Visit OT Evaluation $OT Eval Moderate Complexity: [place] OTR/L Acute Rehabilitation Services Pager: [phone] Office: [place] 04/24/2018, 2:53 PM

## 2018-04-25 MED ORDER — OXYCODONE HCL 5 MG PO TABS: 5.0000 mg | ORAL_TABLET | ORAL | 0 refills | Status: DC | PRN

## 2018-04-25 MED ORDER — ASPIRIN 81 MG PO CHEW: 81.0000 mg | CHEWABLE_TABLET | Freq: Two times a day (BID) | ORAL | 0 refills | Status: AC

## 2018-04-25 NOTE — Care Management Note (Signed)
Case Management Note  Patient Details  Name: KILIAN SCHWARTZ MRN: [id number] Date of Birth: March 29, 1951  Subjective/Objective:   67 yr old male s/p right total hip arthroplasty.                  Action/Plan: Patient was preoperatively setup with [facility], no changes. Will have support at discharge.    Expected Discharge Date:  04/25/18               Expected Discharge Plan:  [place]  In-House Referral:  NA  Discharge planning Services  CM Consult  Post Acute Care Choice:  Durable Medical Equipment, Home Health Choice offered to:  Patient  DME Arranged:  3-N-1, Walker rolling DME Agency:  [place]:  PT [place] Agency:  [facility] (formerly [facility])  Status of Service:  Completed, signed off  If discussed at [employer], dates discussed:    Additional Comments:  Ninfa Meeker, RN 04/25/2018, 1:02 PM

## 2018-04-25 NOTE — Progress Notes (Signed)
Subjective: 2 Days Post-Op Procedure(s) (LRB): RIGHT TOTAL HIP ARTHROPLASTY ANTERIOR APPROACH (Right) Patient reports pain as mild.  No complaints wants to go home today.   Objective: Vital signs in last 24 hours: Temp:  [98.9 F (37.2 C)-99.7 F (37.6 C)] 99.6 F (37.6 C) (12/12 0431) Pulse Rate:  [89-98] 90 (12/12 0431) Resp:  [16-20] 16 (12/12 0431) BP: (137-152)/(81-98) 143/98 (12/12 0431) SpO2:  [96 %-98 %] 96 % (12/12 0431)  Intake/Output from previous day: 12/11 0701 - 12/12 0700 In: 2585 [P.O.:1080; I.V.:286] Out: 1100 [Urine:1100] Intake/Output this shift: Total I/O In: 240 [P.O.:240] Out: -   Recent Labs    04/24/18 0212  HGB 10.7*   Recent Labs    04/24/18 0212  WBC 10.7*  RBC 3.79*  HCT 34.0*  PLT 241   Recent Labs    04/23/18 0618 04/24/18 0212  NA  --  142  K 3.8 3.6  CL  --  106  CO2  --  30  BUN  --  22  CREATININE  --  1.02  GLUCOSE  --  126*  CALCIUM  --  8.6*   No results for input(s): LABPT, INR in the last 72 hours.  Right lower extremity: Sensation intact distally Dorsiflexion/Plantar flexion intact Incision: dressing C/D/I Compartment soft    Assessment/Plan: 2 Days Post-Op Procedure(s) (LRB): RIGHT TOTAL HIP ARTHROPLASTY ANTERIOR APPROACH (Right) Discharge home with home health      [place] 04/25/2018, 12:40 PM

## 2018-04-25 NOTE — Discharge Instructions (Signed)

## 2018-04-25 NOTE — Progress Notes (Signed)
Occupational Therapy Treatment Patient Details Name: Wyatt Wilson MRN: [id number] DOB: 12/16/1950 Today's Date: 04/25/2018    History of present illness Pt is a 67 y.o. male s/p R THA (direct anterior approach) on 04/23/18. PMH includes HTN, arthritis.   OT comments  Pt progressing towards OT goals this session. Focus was on dressing and AE for LB ADL. Pt able to verbalize and demonstrate understanding. Current POC remains appropriate at this time. Thank you for the opportunity to serve this patient!   Follow Up Recommendations  No OT follow up;Supervision - Intermittent    Equipment Recommendations  3 in 1 bedside commode    Recommendations for Other Services      Precautions / Restrictions Precautions Precautions: Fall Restrictions Weight Bearing Restrictions: Yes RLE Weight Bearing: Weight bearing as tolerated       Mobility Bed Mobility Overal bed mobility: Modified Independent                Transfers Overall transfer level: Needs assistance Equipment used: Rolling walker (2 wheeled) Transfers: Sit to/from Stand Sit to Stand: Supervision         General transfer comment: supervision for safety    Balance Overall balance assessment: Needs assistance   Sitting balance-Leahy Scale: Good       Standing balance-Leahy Scale: Fair Standing balance comment: Can static stand without UE support                           ADL either performed or assessed with clinical judgement   ADL Overall ADL's : Needs assistance/impaired     Grooming: Wash/dry hands;Wash/dry face;Oral care;Supervision/safety;Standing Grooming Details (indicate cue type and reason): sink level     Lower Body Bathing: Min guard;Sit to/from stand;With adaptive equipment Lower Body Bathing Details (indicate cue type and reason): educated on long handle sponge     Lower Body Dressing: Minimal assistance;Sit to/from stand;With adaptive equipment Lower Body Dressing  Details (indicate cue type and reason): educated with demo from AE kit - reacher/grabber, long handle shoe horn, sock donner Toilet Transfer: Supervision/safety;Ambulation;RW;Cueing for safety   Toileting- Clothing Manipulation and Hygiene: Supervision/safety;Sitting/lateral lean       Functional mobility during ADLs: Supervision/safety;Rolling walker;Cueing for safety       Vision       Perception     Praxis      Cognition Arousal/Alertness: Awake/alert Behavior During Therapy: WFL for tasks assessed/performed Overall Cognitive Status: Within Functional Limits for tasks assessed                                          Exercises     Shoulder Instructions       General Comments      Pertinent Vitals/ Pain       Pain Assessment: 0-10 Pain Score: 3  Pain Location: R hip Pain Descriptors / Indicators: Sore;Tightness Pain Intervention(s): Monitored during session;Repositioned  Home Living                                          Prior Functioning/Environment              Frequency  Min 2X/week        Progress Toward Goals  OT Goals(current goals can now  be found in the care plan section)  Progress towards OT goals: Progressing toward goals  Acute Rehab OT Goals Patient Stated Goal: get back to walking without pain OT Goal Formulation: With patient Time For Goal Achievement: 05/08/18 Potential to Achieve Goals: Good  Plan Discharge plan remains appropriate    Co-evaluation                 AM-PAC OT "6 Clicks" Daily Activity     Outcome Measure   Help from another person eating meals?: None Help from another person taking care of personal grooming?: A Little Help from another person toileting, which includes using toliet, bedpan, or urinal?: A Little Help from another person bathing (including washing, rinsing, drying)?: A Lot Help from another person to put on and taking off regular upper body  clothing?: None Help from another person to put on and taking off regular lower body clothing?: A Lot 6 Click Score: 18    End of Session Equipment Utilized During Treatment: Gait belt;Rolling walker  OT Visit Diagnosis: Other abnormalities of gait and mobility (R26.89);Pain Pain - Right/Left: Right Pain - part of body: Hip   Activity Tolerance Patient tolerated treatment well   Patient Left in chair;with call bell/phone within reach   Nurse Communication Mobility status        Time: 6859-9234 OT Time Calculation (min): 31 min  Charges: OT General Charges $OT Visit: 1 Visit OT Treatments $Self Care/Home Management : 23-37 mins  Hulda Humphrey OTR/L Acute Rehabilitation Services Pager: [phone] Office: [place] 04/25/2018, 2:05 PM

## 2018-04-25 NOTE — Discharge Summary (Signed)
Patient ID: Wyatt Wilson MRN: [id number] DOB/AGE: August 30, 1950 67 y.o.  Admit date: 04/23/2018 Discharge date: 04/25/2018  Admission Diagnoses:  Principal Problem:   Primary osteoarthritis of right hip Active Problems:   Status post total replacement of right hip   Discharge Diagnoses:  Same  Past Medical History:  Diagnosis Date  . Arthritis    "took it out of my right hip" (04/23/2018)  . GERD (gastroesophageal reflux disease)    "occasionally" (04/23/2018)  . Hypercholesterolemia   . Hypertension     Surgeries: Procedure(s): RIGHT TOTAL HIP ARTHROPLASTY ANTERIOR APPROACH on 04/23/2018   Consultants:   Discharged Condition: Improved  Hospital Course: JOSEANDRES MAZER is an 67 y.o. male who was admitted 04/23/2018 for operative treatment ofPrimary osteoarthritis of right hip. Patient has severe unremitting pain that affects sleep, daily activities, and work/hobbies. After pre-op clearance the patient was taken to the operating room on 04/23/2018 and underwent  Procedure(s): RIGHT TOTAL HIP ARTHROPLASTY ANTERIOR APPROACH.    Patient was given perioperative antibiotics:  Anti-infectives (From admission, onward)   Start     Dose/Rate Route Frequency Ordered Stop   04/23/18 1400  ceFAZolin (ANCEF) IVPB 1 g/50 mL premix     1 g 100 mL/hr over 30 Minutes Intravenous Every 6 hours 04/23/18 1146 04/23/18 2046   04/23/18 0600  ceFAZolin (ANCEF) IVPB 2g/100 mL premix     2 g 200 mL/hr over 30 Minutes Intravenous On call to O.R. 04/23/18 0542 04/23/18 0739   04/23/18 0550  ceFAZolin (ANCEF) 2-4 GM/100ML-% IVPB    Note to Pharmacy:  Harden Mo   : cabinet override      04/23/18 0550 04/23/18 0739       Patient was given sequential compression devices, early ambulation, and chemoprophylaxis to prevent DVT.  Patient benefited maximally from hospital stay and there were no complications.    Recent vital signs:  Patient Vitals for the past 24 hrs:  BP Temp Temp src Pulse  Resp SpO2  04/25/18 0431 (!) 143/98 99.6 F (37.6 C) Oral 90 16 96 %  04/24/18 2000 (!) 152/87 99.7 F (37.6 C) Oral 98 20 98 %  04/24/18 1317 137/81 98.9 F (37.2 C) Oral 89 18 96 %     Recent laboratory studies:  Recent Labs    04/23/18 0618 04/24/18 0212  WBC  --  10.7*  HGB  --  10.7*  HCT  --  34.0*  PLT  --  241  NA  --  142  K 3.8 3.6  CL  --  106  CO2  --  30  BUN  --  22  CREATININE  --  1.02  GLUCOSE  --  126*  CALCIUM  --  8.6*     Discharge Medications:   Allergies as of 04/25/2018   No Known Allergies     Medication List    TAKE these medications   amLODipine 5 MG tablet Commonly known as:  NORVASC Take 5 mg by mouth at bedtime.   aspirin 81 MG chewable tablet Chew 1 tablet (81 mg total) by mouth 2 (two) times daily.   diclofenac 75 MG EC tablet Commonly known as:  VOLTAREN Take 75 mg by mouth daily.   EUCRISA 2 % Oint Generic drug:  Crisaborole Apply 1 application topically daily as needed (for psoriasis).   lisinopril-hydrochlorothiazide 20-25 MG tablet Commonly known as:  PRINZIDE,ZESTORETIC Take 1 tablet by mouth daily.   oxyCODONE 5 MG immediate release tablet Commonly known as:  Oxy IR/ROXICODONE Take 1-2 tablets (5-10 mg total) by mouth every 3 (three) hours as needed for moderate pain (pain score 4-6).   pravastatin 20 MG tablet Commonly known as:  PRAVACHOL Take 20 mg by mouth at bedtime.   tiZANidine 4 MG tablet Commonly known as:  ZANAFLEX Take 4 mg by mouth every 6 (six) hours as needed for muscle spasms.            Durable Medical Equipment  (From admission, onward)         Start     Ordered   04/23/18 1323  DME 3 n 1  Once     04/23/18 1322   04/23/18 1323  DME Walker rolling  Once    Question:  Patient needs a walker to treat with the following condition  Answer:  Status post total replacement of right hip   04/23/18 1322          Diagnostic Studies: Dg Pelvis Portable  Result Date:  04/23/2018 CLINICAL DATA:  67 year old male post right hip replacement. Osteoarthritis. Subsequent encounter. EXAM: PORTABLE PELVIS 1-2 VIEWS COMPARISON:  04/23/2018 intraoperative exam. 11/30/2017 preoperative exam. FINDINGS: Post total right hip replacement which appears in satisfactory position without complication noted on frontal projection. Mild left hip joint degenerative changes. Mild degenerative changes lower lumbar spine incompletely assessed. IMPRESSION: Post right hip replacement. Electronically Signed   By: Genia Del M.D.   On: 04/23/2018 09:33   Dg C-arm 1-60 Min  Result Date: 04/23/2018 CLINICAL DATA:  Right hip replacement EXAM: DG C-ARM 61-120 MIN; OPERATIVE RIGHT HIP WITH PELVIS COMPARISON:  None. FINDINGS: Changes of right hip replacement. Normal AP alignment. No hardware or bony complicating feature. IMPRESSION: Right hip replacement.  No visible complicating feature. Electronically Signed   By: Rolm Baptise M.D.   On: 04/23/2018 09:25   Dg Hip Operative Unilat With Pelvis Right  Result Date: 04/23/2018 CLINICAL DATA:  Right hip replacement EXAM: DG C-ARM 61-120 MIN; OPERATIVE RIGHT HIP WITH PELVIS COMPARISON:  None. FINDINGS: Changes of right hip replacement. Normal AP alignment. No hardware or bony complicating feature. IMPRESSION: Right hip replacement.  No visible complicating feature. Electronically Signed   By: Rolm Baptise M.D.   On: 04/23/2018 09:25    Disposition: Discharge disposition: 01-Home or [place]    Mcarthur Rossetti, MD Follow up in 2 week(s).   Specialty:  Orthopedic Surgery Contact information: [place] [address] [phone]            Signed: Erskine Emery 04/25/2018, 12:42 PM

## 2018-04-25 NOTE — Progress Notes (Signed)
Physical Therapy Treatment Patient Details Name: Wyatt Wilson MRN: [id number] DOB: [birth year] Today's Date: 04/25/2018    History of Present Illness Pt is a 67 y.o. male s/p R THA (direct anterior approach) on 04/23/18. PMH includes HTN, arthritis.    PT Comments    Patient is making good progress with PT.  From a mobility standpoint anticipate patient will be ready for DC home when medically ready.    Follow Up Recommendations  Follow surgeon's recommendation for DC plan and follow-up therapies;Supervision for mobility/OOB     Equipment Recommendations  Rolling walker with 5" wheels;3in1 (PT)    Recommendations for Other Services OT consult     Precautions / Restrictions Precautions Precautions: Fall Restrictions Weight Bearing Restrictions: Yes RLE Weight Bearing: Weight bearing as tolerated    Mobility  Bed Mobility Overal bed mobility: Modified Independent                Transfers Overall transfer level: Needs assistance Equipment used: Rolling walker (2 wheeled) Transfers: Sit to/from Stand Sit to Stand: Supervision         General transfer comment: supervision for safety  Ambulation/Gait Ambulation/Gait assistance: Supervision Gait Distance (Feet): 500 Feet Assistive device: Rolling walker (2 wheeled) Gait Pattern/deviations: Step-through pattern;Decreased weight shift to right;Decreased stride length     General Gait Details: steady gait and less antalgic pattern; initially cues for step length symmetry which pt is able to improve   Stairs Stairs: Yes Stairs assistance: Supervision Stair Management: One rail Right;Step to pattern;Sideways Number of Stairs: 3 General stair comments: pt demonstrates carry over of sequencing and technique   Wheelchair Mobility    Modified Rankin (Stroke Patients Only)       Balance Overall balance assessment: Needs assistance   Sitting balance-Leahy Scale: Good       Standing balance-Leahy  Scale: Fair Standing balance comment: Can static stand without UE support                            Cognition Arousal/Alertness: Awake/alert Behavior During Therapy: WFL for tasks assessed/performed Overall Cognitive Status: Within Functional Limits for tasks assessed                                        Exercises Total Joint Exercises Hip ABduction/ADduction: AROM;Strengthening;Right;10 reps;Standing Knee Flexion: AROM;Strengthening;Right;10 reps;Standing Marching in Standing: Right;Strengthening;Standing;10 reps;AROM Standing Hip Extension: AROM;Strengthening;Right;10 reps;Standing    General Comments        Pertinent Vitals/Pain Pain Assessment: 0-10 Pain Score: 2  Pain Location: R hip Pain Descriptors / Indicators: Sore;Tightness Pain Intervention(s): Monitored during session;Repositioned    Home Living                      Prior Function            PT Goals (current goals can now be found in the care plan section) Progress towards PT goals: Progressing toward goals    Frequency    7X/week      PT Plan Current plan remains appropriate    Co-evaluation              AM-PAC PT "6 Clicks" Mobility   Outcome Measure  Help needed turning from your back to your side while in a flat bed without using bedrails?: None Help needed moving from lying on your back to  sitting on the side of a flat bed without using bedrails?: None Help needed moving to and from a bed to a chair (including a wheelchair)?: None Help needed standing up from a chair using your arms (e.g., wheelchair or bedside chair)?: None Help needed to walk in hospital room?: None Help needed climbing 3-5 steps with a railing? : A Little 6 Click Score: 23    End of Session Equipment Utilized During Treatment: Gait belt Activity Tolerance: Patient tolerated treatment well Patient left: with call bell/phone within reach;in chair Nurse Communication:  Mobility status PT Visit Diagnosis: Other abnormalities of gait and mobility (R26.89)     Time: 1601-0932 PT Time Calculation (min) (ACUTE ONLY): 26 min  Charges:  $Gait Training: 8-22 mins $Therapeutic Exercise: 8-22 mins                     Earney Navy, PTA Acute Rehabilitation Services Pager: [phone] Office: [phone]     Darliss Cheney 04/25/2018, 9:06 AM

## 2018-04-25 NOTE — Progress Notes (Signed)
Pt given discharge instructions and gone over with him. Answered all questions. All belongings gathered to be sent home. Pt waiting on equipment and ride.

## 2018-04-26 DIAGNOSIS — Z86010 Personal history of colonic polyps: Secondary | ICD-10-CM | POA: Diagnosis not present

## 2018-04-26 DIAGNOSIS — I10 Essential (primary) hypertension: Secondary | ICD-10-CM | POA: Diagnosis not present

## 2018-04-26 DIAGNOSIS — Z471 Aftercare following joint replacement surgery: Secondary | ICD-10-CM | POA: Diagnosis not present

## 2018-04-26 DIAGNOSIS — Z86001 Personal history of in-situ neoplasm of cervix uteri: Secondary | ICD-10-CM | POA: Diagnosis not present

## 2018-04-26 DIAGNOSIS — Z87891 Personal history of nicotine dependence: Secondary | ICD-10-CM | POA: Diagnosis not present

## 2018-04-26 DIAGNOSIS — E782 Mixed hyperlipidemia: Secondary | ICD-10-CM | POA: Diagnosis not present

## 2018-04-26 DIAGNOSIS — M47816 Spondylosis without myelopathy or radiculopathy, lumbar region: Secondary | ICD-10-CM | POA: Diagnosis not present

## 2018-04-26 DIAGNOSIS — Z96641 Presence of right artificial hip joint: Secondary | ICD-10-CM | POA: Diagnosis not present

## 2018-04-26 DIAGNOSIS — K219 Gastro-esophageal reflux disease without esophagitis: Secondary | ICD-10-CM | POA: Diagnosis not present

## 2018-04-30 DIAGNOSIS — Z471 Aftercare following joint replacement surgery: Secondary | ICD-10-CM | POA: Diagnosis not present

## 2018-04-30 DIAGNOSIS — Z87891 Personal history of nicotine dependence: Secondary | ICD-10-CM | POA: Diagnosis not present

## 2018-04-30 DIAGNOSIS — I10 Essential (primary) hypertension: Secondary | ICD-10-CM | POA: Diagnosis not present

## 2018-04-30 DIAGNOSIS — Z96641 Presence of right artificial hip joint: Secondary | ICD-10-CM | POA: Diagnosis not present

## 2018-04-30 DIAGNOSIS — M47816 Spondylosis without myelopathy or radiculopathy, lumbar region: Secondary | ICD-10-CM | POA: Diagnosis not present

## 2018-04-30 DIAGNOSIS — E782 Mixed hyperlipidemia: Secondary | ICD-10-CM | POA: Diagnosis not present

## 2018-04-30 DIAGNOSIS — K219 Gastro-esophageal reflux disease without esophagitis: Secondary | ICD-10-CM | POA: Diagnosis not present

## 2018-04-30 DIAGNOSIS — Z86010 Personal history of colonic polyps: Secondary | ICD-10-CM | POA: Diagnosis not present

## 2018-04-30 NOTE — Telephone Encounter (Signed)
This encounter was created in error - please disregard.

## 2018-05-01 ENCOUNTER — Other Ambulatory Visit: Payer: Self-pay

## 2018-05-01 NOTE — Patient Outreach (Signed)
[facility]) Care Management  05/01/2018  TRACEN MAHLER [birth year] [id number]  Transition of care  Referral date: 04/29/18 Referral source: Discharged from [facility] on 04/25/18 Insurance: [employer]  Telephone call to patient regarding transition of care referral. Unable to reach patient. HIPAA compliant voice message left with call back phone number.   PLAN:  RNCm will attempt 2nd telephone call to patient within 4 business days.   Quinn Plowman RN,BSN,CCM [facility] Telephonic  [phone]

## 2018-05-02 ENCOUNTER — Other Ambulatory Visit: Payer: Self-pay

## 2018-05-02 DIAGNOSIS — M47816 Spondylosis without myelopathy or radiculopathy, lumbar region: Secondary | ICD-10-CM | POA: Diagnosis not present

## 2018-05-02 DIAGNOSIS — Z471 Aftercare following joint replacement surgery: Secondary | ICD-10-CM | POA: Diagnosis not present

## 2018-05-02 DIAGNOSIS — Z87891 Personal history of nicotine dependence: Secondary | ICD-10-CM | POA: Diagnosis not present

## 2018-05-02 DIAGNOSIS — E782 Mixed hyperlipidemia: Secondary | ICD-10-CM | POA: Diagnosis not present

## 2018-05-02 DIAGNOSIS — K219 Gastro-esophageal reflux disease without esophagitis: Secondary | ICD-10-CM | POA: Diagnosis not present

## 2018-05-02 DIAGNOSIS — Z96641 Presence of right artificial hip joint: Secondary | ICD-10-CM | POA: Diagnosis not present

## 2018-05-02 DIAGNOSIS — Z86010 Personal history of colonic polyps: Secondary | ICD-10-CM | POA: Diagnosis not present

## 2018-05-02 DIAGNOSIS — I10 Essential (primary) hypertension: Secondary | ICD-10-CM | POA: Diagnosis not present

## 2018-05-02 NOTE — Patient Outreach (Signed)
[facility]) Care Management  05/02/2018  Wyatt Wilson August 15, 1950 [id number]  Transition of care  Referral date: 04/29/18 Referral source: Discharged from [facility] on 04/25/18 Insurance: [employer]  Received return call from patient. HIPAA verified. Explained reason for outreach. Patient states he has been doing pretty good.  Patient states he has a follow up with the orthopedic surgeon on 05/06/18.  Patient report he still has the strips on his incision.  RNCM reviewed signs/ symptoms of infection with patient. Patient denies any symptoms. Patient states he still takes his pain medication if and when needed. Patient reports having transportation assistance to his appointment. Patient states he has his medications and takes them as prescribed.  Patient denies any symptoms. RNCM discussed and offered ongoing transition of care follow up. Patient declined.  RNCM advised patient to notify MD of any changes in condition prior to scheduled appointment. RNCM provided contact name and number: [phone] or main office number [phone] and 24 hour nurse advise line [phone] by mail.  RNCM verified patient aware of 911 services for urgent/ emergent needs.,  PLAN:  RNCM will close patient due to refusal of services.  RNCM will send patient [facility] care management brochure/ magnet RNCM will send patients primary MD closure letter   Wyatt Plowman RN,BSN,CCM [facility] Telephonic  [phone]

## 2018-05-02 NOTE — Patient Outreach (Signed)
[facility]) Care Management  05/02/2018  Wyatt Wilson 04-14-1951 [id number]  Transition of care  Referral date: 04/29/18 Referral source: Discharged from [facility] on 04/25/18 Insurance: [employer] Attempt #2   Telephone call to patient regarding transition of care referral.  Unable to reach patient. HIPAA compliant voice message left with call back phone number.   PLAN: RNCM will attempt 3rd  telephone call to patient within 4 business days.   Quinn Plowman RN,BSN, [place] Telephonic  [phone]

## 2018-05-03 ENCOUNTER — Telehealth (INDEPENDENT_AMBULATORY_CARE_PROVIDER_SITE_OTHER): Payer: Self-pay | Admitting: Physician Assistant

## 2018-05-03 MED ORDER — SORBITOL 70 % PO SOLN: 15.0000 mL | Freq: Every day | ORAL | 0 refills | Status: DC | PRN

## 2018-05-03 NOTE — Telephone Encounter (Signed)
IC LMVM advising will ask you about this, per message he has tried OTC LOC, and it is not helping.  Is there something you'd send in?  Or what Wyatt Wilson I advise him?

## 2018-05-03 NOTE — Telephone Encounter (Signed)
Patient left a message stating that everything is going well with the healing process except that he is constipated.  He stated that he has tried drinking water and taking over the counter medication and nothing has worked.  He wanted to know if there is anything else that he can take to help go to the bathroom.  CB#[phone].  Thank you.

## 2018-05-03 NOTE — Telephone Encounter (Signed)
I called and lmom advising patient of message attached

## 2018-05-03 NOTE — Telephone Encounter (Signed)
I sent in some sorbitol to try.  He can always try an enema over-the-counter as a last resort.  I can also send in a suppository

## 2018-05-03 NOTE — Telephone Encounter (Signed)
Can you call patient and tell him below per Dr Ninfa Linden?  Thanks.

## 2018-05-06 ENCOUNTER — Ambulatory Visit (INDEPENDENT_AMBULATORY_CARE_PROVIDER_SITE_OTHER): Payer: Medicare HMO | Admitting: Physician Assistant

## 2018-05-06 ENCOUNTER — Ambulatory Visit: Payer: Self-pay

## 2018-05-06 ENCOUNTER — Encounter (INDEPENDENT_AMBULATORY_CARE_PROVIDER_SITE_OTHER): Payer: Self-pay | Admitting: Physician Assistant

## 2018-05-06 DIAGNOSIS — M1611 Unilateral primary osteoarthritis, right hip: Secondary | ICD-10-CM

## 2018-05-06 NOTE — Progress Notes (Signed)
HPI: Mr. Privott returns today 2 weeks status post right total hip arthroplasty.  He has had no fever chills shortness of breath chest pain.  He has had some problems with constipation.  He has stool softeners at home.  He has had some flatus.  He is also complaining of right knee pain.  He is taking no pain medications now.  Physical exam: Right hip surgical incisions healing well.  Ambulates with a walker.  Overall good range of motion right hip.  Calf supple nontender.  Impression: Status post right total hip arthroplasty 04/23/2017  Plan: He is able to get his incision wet.  He will finish up with physical therapy and advance to a cane in off the cane as tolerated.  Follow-up with [place] in 1 month sooner if there is any questions or concerns.  In regards to his constipation if it continues or becomes worse, he will contact his primary care physician.

## 2018-05-07 DIAGNOSIS — Z86010 Personal history of colonic polyps: Secondary | ICD-10-CM | POA: Diagnosis not present

## 2018-05-07 DIAGNOSIS — E782 Mixed hyperlipidemia: Secondary | ICD-10-CM | POA: Diagnosis not present

## 2018-05-07 DIAGNOSIS — K219 Gastro-esophageal reflux disease without esophagitis: Secondary | ICD-10-CM | POA: Diagnosis not present

## 2018-05-07 DIAGNOSIS — Z96641 Presence of right artificial hip joint: Secondary | ICD-10-CM | POA: Diagnosis not present

## 2018-05-07 DIAGNOSIS — I10 Essential (primary) hypertension: Secondary | ICD-10-CM | POA: Diagnosis not present

## 2018-05-07 DIAGNOSIS — M47816 Spondylosis without myelopathy or radiculopathy, lumbar region: Secondary | ICD-10-CM | POA: Diagnosis not present

## 2018-05-07 DIAGNOSIS — Z471 Aftercare following joint replacement surgery: Secondary | ICD-10-CM | POA: Diagnosis not present

## 2018-05-07 DIAGNOSIS — Z87891 Personal history of nicotine dependence: Secondary | ICD-10-CM | POA: Diagnosis not present

## 2018-05-09 DIAGNOSIS — I10 Essential (primary) hypertension: Secondary | ICD-10-CM | POA: Diagnosis not present

## 2018-05-09 DIAGNOSIS — Z87891 Personal history of nicotine dependence: Secondary | ICD-10-CM | POA: Diagnosis not present

## 2018-05-09 DIAGNOSIS — K219 Gastro-esophageal reflux disease without esophagitis: Secondary | ICD-10-CM | POA: Diagnosis not present

## 2018-05-09 DIAGNOSIS — Z96641 Presence of right artificial hip joint: Secondary | ICD-10-CM | POA: Diagnosis not present

## 2018-05-09 DIAGNOSIS — Z86010 Personal history of colonic polyps: Secondary | ICD-10-CM | POA: Diagnosis not present

## 2018-05-09 DIAGNOSIS — M47816 Spondylosis without myelopathy or radiculopathy, lumbar region: Secondary | ICD-10-CM | POA: Diagnosis not present

## 2018-05-09 DIAGNOSIS — Z471 Aftercare following joint replacement surgery: Secondary | ICD-10-CM | POA: Diagnosis not present

## 2018-05-09 DIAGNOSIS — E782 Mixed hyperlipidemia: Secondary | ICD-10-CM | POA: Diagnosis not present

## 2018-05-13 ENCOUNTER — Telehealth (INDEPENDENT_AMBULATORY_CARE_PROVIDER_SITE_OTHER): Payer: Self-pay

## 2018-05-13 NOTE — Telephone Encounter (Signed)
Patient would like to know if he can take Diclofenac for his pain at night?  Stated that the Oxycodone makes him constipated and the Diclofenac helps much better.  Patient had Right Total Hip surgery on 04/23/2018.  Cb# is [phone].  Please advise.  Thank you.

## 2018-05-14 NOTE — Telephone Encounter (Signed)
That should be fine now right? He should be off his aspirin by now?

## 2018-05-14 NOTE — Telephone Encounter (Signed)
LMOM for patient letting him know the below message  

## 2018-05-14 NOTE — Telephone Encounter (Signed)
That will be fine. 

## 2018-05-23 ENCOUNTER — Telehealth (INDEPENDENT_AMBULATORY_CARE_PROVIDER_SITE_OTHER): Payer: Self-pay | Admitting: Radiology

## 2018-05-23 NOTE — Telephone Encounter (Signed)
Please advise 

## 2018-05-23 NOTE — Telephone Encounter (Signed)
LMOM for patient of the below message  

## 2018-05-23 NOTE — Telephone Encounter (Signed)
Let him know the numbness can be normal after surgery and should get better with time.  We will evaluate it at his next appointment.

## 2018-05-23 NOTE — Telephone Encounter (Signed)
Patient called stating the is having some numbness at the knee, he states that he had been using an Ice pack there and that he had been sleeping with it on the leg.  hes not sure if this is what has done this or not.  Please call him back to advise.  He does states that he has some swelling that has improved some but the leg looks normal.  He is concerned about the numbness.

## 2018-06-03 ENCOUNTER — Encounter (INDEPENDENT_AMBULATORY_CARE_PROVIDER_SITE_OTHER): Payer: Self-pay | Admitting: Physician Assistant

## 2018-06-03 ENCOUNTER — Ambulatory Visit (INDEPENDENT_AMBULATORY_CARE_PROVIDER_SITE_OTHER): Payer: Medicare HMO | Admitting: Physician Assistant

## 2018-06-03 VITALS — Ht 69.0 in | Wt 200.0 lb

## 2018-06-03 DIAGNOSIS — Z87891 Personal history of nicotine dependence: Secondary | ICD-10-CM | POA: Diagnosis not present

## 2018-06-03 DIAGNOSIS — Z96641 Presence of right artificial hip joint: Secondary | ICD-10-CM

## 2018-06-03 DIAGNOSIS — Z86001 Personal history of in-situ neoplasm of cervix uteri: Secondary | ICD-10-CM | POA: Diagnosis not present

## 2018-06-03 DIAGNOSIS — I10 Essential (primary) hypertension: Secondary | ICD-10-CM | POA: Diagnosis not present

## 2018-06-03 DIAGNOSIS — Z471 Aftercare following joint replacement surgery: Secondary | ICD-10-CM | POA: Diagnosis not present

## 2018-06-03 DIAGNOSIS — M47816 Spondylosis without myelopathy or radiculopathy, lumbar region: Secondary | ICD-10-CM | POA: Diagnosis not present

## 2018-06-03 DIAGNOSIS — E782 Mixed hyperlipidemia: Secondary | ICD-10-CM | POA: Diagnosis not present

## 2018-06-03 DIAGNOSIS — K219 Gastro-esophageal reflux disease without esophagitis: Secondary | ICD-10-CM | POA: Diagnosis not present

## 2018-06-03 NOTE — Progress Notes (Signed)
HPI: Wyatt Wilson returns today status post right total hip arthroplasty 6 weeks now.  He is overall doing well is weightbearing often without a cane but does bring a cane in today.  He states the hip feels really good he is able to tie his shoes and put on his socks which he had difficulty with prior to surgery.  Said no chest pain shortness breath fevers chills.  He has had an area of numbness distal right anterior thigh this began after he left an ice pack on it for some time.  Physical exam: General well-developed well-nourished male no acute distress Right hip: Surgical incisions healing well no signs of infection.  No skin breakdown erythema.  Does have decreased sensation over her large area of the distal thigh.  Calf supple nontender.  Dorsiflexion plantarflexion ankle intact.  Able to ambulate about the room without the walker or cane.  Impression: Status post right total hip arthroplasty 04/23/2018  Plan: He will continue to work on scar tissue mobilization of the right hip.  Discussed with him that most likely the numbness in the distal thigh is due to the ice ice treatment and this should resolve with time.  He will continue to work on strengthening range of motion and scar tissue mobilization.  Follow-up with [place] in 6 months AP pelvis lateral view of the right hip at that time.  Questions encouraged and answered.

## 2018-06-05 ENCOUNTER — Telehealth (INDEPENDENT_AMBULATORY_CARE_PROVIDER_SITE_OTHER): Payer: Self-pay | Admitting: Orthopaedic Surgery

## 2018-06-05 NOTE — Telephone Encounter (Signed)
Patient left a message what to know when he can start donating platelets.  He had surgery on December 10th.  CB#[phone].  Thank you.

## 2018-06-05 NOTE — Telephone Encounter (Signed)
See below

## 2018-06-05 NOTE — Telephone Encounter (Signed)
Patient aware of the below message  

## 2018-06-05 NOTE — Telephone Encounter (Signed)
He needs to wait to donate platelets until he is at least over 3 months out from his hip replacement.

## 2018-06-06 ENCOUNTER — Telehealth (INDEPENDENT_AMBULATORY_CARE_PROVIDER_SITE_OTHER): Payer: Self-pay | Admitting: Orthopaedic Surgery

## 2018-06-06 NOTE — Telephone Encounter (Signed)
Returned call to patient left voicemail message reading note to patient from Dr Ninfa Linden stating patient need to wait until he's at least over 3 months out from his hip surgery before donating platelets.

## 2018-07-03 ENCOUNTER — Telehealth (INDEPENDENT_AMBULATORY_CARE_PROVIDER_SITE_OTHER): Payer: Self-pay

## 2018-07-03 NOTE — Telephone Encounter (Signed)
Faxed to provided number  

## 2018-07-03 NOTE — Telephone Encounter (Signed)
[facility] called needing a letter faxed concerning Pre-Dental Antibiotics for patient's appointment in [place].  Fax# [phone], CB# [phone].  Please advise.  Thank you.

## 2018-07-24 DIAGNOSIS — Z125 Encounter for screening for malignant neoplasm of prostate: Secondary | ICD-10-CM | POA: Diagnosis not present

## 2018-07-24 DIAGNOSIS — I10 Essential (primary) hypertension: Secondary | ICD-10-CM | POA: Diagnosis not present

## 2018-07-24 DIAGNOSIS — N529 Male erectile dysfunction, unspecified: Secondary | ICD-10-CM | POA: Diagnosis not present

## 2018-07-24 DIAGNOSIS — R7301 Impaired fasting glucose: Secondary | ICD-10-CM | POA: Diagnosis not present

## 2018-07-24 DIAGNOSIS — E782 Mixed hyperlipidemia: Secondary | ICD-10-CM | POA: Diagnosis not present

## 2018-07-24 DIAGNOSIS — R7303 Prediabetes: Secondary | ICD-10-CM | POA: Diagnosis not present

## 2018-07-30 ENCOUNTER — Telehealth: Payer: Self-pay | Admitting: Orthopedic Surgery

## 2018-07-30 NOTE — Telephone Encounter (Signed)
Patient called to relay he is having left shoulder pain and that he "thinks it happened at work."  Discussed appointment, which would need to follow workers comp protocol. Discussed reporting to employer if not already done. Patient said it happened almost 3 months ago, and he thought it would get better but it has not.  Will check back with [place] after speaking with his workplace.

## 2018-07-31 DIAGNOSIS — R7303 Prediabetes: Secondary | ICD-10-CM | POA: Diagnosis not present

## 2018-07-31 DIAGNOSIS — I10 Essential (primary) hypertension: Secondary | ICD-10-CM | POA: Diagnosis not present

## 2018-07-31 DIAGNOSIS — E663 Overweight: Secondary | ICD-10-CM | POA: Diagnosis not present

## 2018-07-31 DIAGNOSIS — E782 Mixed hyperlipidemia: Secondary | ICD-10-CM | POA: Diagnosis not present

## 2018-07-31 DIAGNOSIS — Z0001 Encounter for general adult medical examination with abnormal findings: Secondary | ICD-10-CM | POA: Diagnosis not present

## 2018-12-02 ENCOUNTER — Ambulatory Visit: Payer: Self-pay | Admitting: Physician Assistant

## 2018-12-04 ENCOUNTER — Other Ambulatory Visit: Payer: Self-pay

## 2018-12-04 ENCOUNTER — Ambulatory Visit (INDEPENDENT_AMBULATORY_CARE_PROVIDER_SITE_OTHER): Payer: Medicare HMO

## 2018-12-04 ENCOUNTER — Ambulatory Visit (INDEPENDENT_AMBULATORY_CARE_PROVIDER_SITE_OTHER): Payer: Medicare HMO | Admitting: Physician Assistant

## 2018-12-04 ENCOUNTER — Encounter: Payer: Self-pay | Admitting: Physician Assistant

## 2018-12-04 VITALS — Ht 69.0 in | Wt 205.0 lb

## 2018-12-04 DIAGNOSIS — Z96641 Presence of right artificial hip joint: Secondary | ICD-10-CM | POA: Diagnosis not present

## 2018-12-04 NOTE — Progress Notes (Signed)
Office Visit Note   Patient: Wyatt Wilson           Date of Birth: February 10, 1951           MRN: [id number] Visit Date: 12/04/2018              Requested by: Celene Squibb, MD [place],   [id number] PCP: Celene Squibb, MD   Assessment & Plan: Visit Diagnoses:  1. Status post total replacement of right hip     Plan: Follow-up at 1 year postop we will obtain an AP pelvis and lateral view of the right hip.  Questions encouraged and answered.  He is activities as tolerated.  Follow-Up Instructions: Return in about 6 months (around 06/06/2019) for Radiographs.   Orders:  Orders Placed This Encounter  Procedures   XR HIP UNILAT W OR W/O PELVIS 2-3 VIEWS RIGHT   No orders of the defined types were placed in this encounter.     Procedures: No procedures performed   Clinical Data: No additional findings.   Subjective: Chief Complaint  Patient presents with   Right Hip - Follow-up    04/23/18 Right THA    HPI Mr. Treiber returns today 6 months status post right total hip arthroplasty.  He states the hip is overall doing well.  He is only complaint is he has some numbness still about the right thigh.  He is very happy with the right total hip arthroplasty. Review of Systems No fevers or chills.  Objective: Vital Signs: Ht 5\' 9"  (1.753 m)    Wt 205 lb (93 kg)    BMI 30.27 kg/m   Physical Exam Constitutional:      Appearance: He is not ill-appearing or diaphoretic.  Pulmonary:     Effort: Pulmonary effort is normal.  Neurological:     Mental Status: He is alert.     Ortho Exam Right hip good range of motion without pain.  He is able to cross his legs.  He ambulates without any assistive device Specialty Comments:  No specialty comments available.  Imaging: Xr Hip Unilat W Or W/o Pelvis 2-3 Views Right  Result Date: 12/04/2018 AP pelvis and lateral view of the right hip: Bilateral hips well located.  Right total hip arthroplasty components  well-seated.  No acute fractures or bony abnormalities.    PMFS History: Patient Active Problem List   Diagnosis Date Noted   Status post total replacement of right hip 04/23/2018   Primary osteoarthritis of right hip 12/12/2017   History of colonic polyps 04/19/2016   Constipation 04/19/2016   HIP, ARTHRITIS, DEGEN./OSTEO 07/14/2009   Past Medical History:  Diagnosis Date   Arthritis    "took it out of my right hip" (04/23/2018)   GERD (gastroesophageal reflux disease)    "occasionally" (04/23/2018)   Hypercholesterolemia    Hypertension     Family History  Problem Relation Age of Onset   Alzheimer's disease Father    Colon cancer Neg Hx    Colon polyps Neg Hx     Past Surgical History:  Procedure Laterality Date   COLONOSCOPY  07/19/2011   Procedure: COLONOSCOPY;  Surgeon: Daneil Dolin, MD;  [facility];  Service: Endoscopy;  Laterality: N/A;  9:00 AM; RESULTS: Mix of tubular adenoma and hyperplastic polyps.   COLONOSCOPY N/A 04/24/2016   Procedure: COLONOSCOPY;  Surgeon: Daneil Dolin, MD;  Location: [facility];  Service: Endoscopy;  Laterality: N/A;  [place]   Cyst removed from back   [place] Right 04/23/2018   TOTAL HIP ARTHROPLASTY Right 04/23/2018   Procedure: RIGHT TOTAL HIP ARTHROPLASTY ANTERIOR APPROACH;  Surgeon: Mcarthur Rossetti, MD;  Location: [facility];  Service: Orthopedics;  Laterality: Right;   Social History   Occupational History   Not on file  Tobacco Use   Smoking status: Former Smoker    Packs/day: 1.50    Years: 30.00    Pack years: 45.00    Types: Cigarettes    Quit date: 03/27/2013    Years since quitting: 5.6   Smokeless tobacco: Never Used  Substance and Sexual Activity   Alcohol use: Yes    Comment: 04/23/2018 "1 beer q few months"   Drug use: No   Sexual activity: Not Currently

## 2019-02-03 DIAGNOSIS — I10 Essential (primary) hypertension: Secondary | ICD-10-CM | POA: Diagnosis not present

## 2019-02-03 DIAGNOSIS — E782 Mixed hyperlipidemia: Secondary | ICD-10-CM | POA: Diagnosis not present

## 2019-02-03 DIAGNOSIS — R7301 Impaired fasting glucose: Secondary | ICD-10-CM | POA: Diagnosis not present

## 2019-02-03 DIAGNOSIS — R7303 Prediabetes: Secondary | ICD-10-CM | POA: Diagnosis not present

## 2019-02-10 DIAGNOSIS — E663 Overweight: Secondary | ICD-10-CM | POA: Diagnosis not present

## 2019-02-10 DIAGNOSIS — R7303 Prediabetes: Secondary | ICD-10-CM | POA: Diagnosis not present

## 2019-02-10 DIAGNOSIS — Z6829 Body mass index (BMI) 29.0-29.9, adult: Secondary | ICD-10-CM | POA: Diagnosis not present

## 2019-02-10 DIAGNOSIS — I10 Essential (primary) hypertension: Secondary | ICD-10-CM | POA: Diagnosis not present

## 2019-02-10 DIAGNOSIS — E782 Mixed hyperlipidemia: Secondary | ICD-10-CM | POA: Diagnosis not present

## 2019-02-28 ENCOUNTER — Telehealth: Payer: Self-pay | Admitting: Orthopaedic Surgery

## 2019-02-28 NOTE — Telephone Encounter (Signed)
Looks like he had this back in you're July note

## 2019-02-28 NOTE — Telephone Encounter (Signed)
Patient called advised his Rt thigh was numb but now  he's feeling pain in it and asked if that was normal. Patient advised he is going to see how it feel over the weekend before he make an appointment to be seen in the office. The number to contact patient is [phone]

## 2019-03-03 ENCOUNTER — Telehealth: Payer: Self-pay | Admitting: Physician Assistant

## 2019-03-03 NOTE — Telephone Encounter (Signed)
Patient called. Would like to speak with someone. His call back number [phone]

## 2019-03-05 NOTE — Telephone Encounter (Signed)
LMOM that I was returning his call and to call back if he still needs to talk and leave me a message

## 2019-03-18 ENCOUNTER — Other Ambulatory Visit: Payer: Self-pay | Admitting: *Deleted

## 2019-03-18 DIAGNOSIS — Z20828 Contact with and (suspected) exposure to other viral communicable diseases: Secondary | ICD-10-CM | POA: Diagnosis not present

## 2019-03-18 DIAGNOSIS — Z20822 Contact with and (suspected) exposure to covid-19: Secondary | ICD-10-CM

## 2019-03-19 LAB — NOVEL CORONAVIRUS, NAA: SARS-CoV-2, NAA: NOT DETECTED

## 2019-04-03 DIAGNOSIS — Z23 Encounter for immunization: Secondary | ICD-10-CM | POA: Diagnosis not present

## 2019-04-04 DIAGNOSIS — Z23 Encounter for immunization: Secondary | ICD-10-CM | POA: Diagnosis not present

## 2019-06-05 ENCOUNTER — Ambulatory Visit: Payer: Medicare HMO | Admitting: Physician Assistant

## 2019-06-05 ENCOUNTER — Encounter: Payer: Self-pay | Admitting: Physician Assistant

## 2019-06-05 ENCOUNTER — Other Ambulatory Visit: Payer: Self-pay

## 2019-06-05 ENCOUNTER — Ambulatory Visit (INDEPENDENT_AMBULATORY_CARE_PROVIDER_SITE_OTHER): Payer: Medicare HMO

## 2019-06-05 DIAGNOSIS — Z96641 Presence of right artificial hip joint: Secondary | ICD-10-CM | POA: Diagnosis not present

## 2019-06-05 NOTE — Progress Notes (Signed)
  HPI: [name] returns today 13 months status post right total hip arthroplasty.  States the hip is doing really well.  His only complaint is stiffness for the first 5 steps in the morning.  But this is slowly getting better.  The numbness and is having anterior aspect of the hip is resolved.  Review of systems: Negative for fevers chills shortness of breath  Physical exam: Ambulates without any assistive device in a nonantalgic gait.  Right hip good range of motion without pain.  Right calf supple nontender.  Dorsiflexion plantarflexion right ankle intact.  Radiographs: AP pelvis lateral view right hip: Status post right total hip arthroplasty with well-seated components.  No evidence of hardware failure.  No bony abnormalities no acute fracture.  Impression: 13 months status post right total hip arthroplasty  Plan: He will follow-up with [place] on an as-needed basis.  Questions encouraged and answered.  He is activities as tolerated.

## 2019-06-06 DIAGNOSIS — E7849 Other hyperlipidemia: Secondary | ICD-10-CM | POA: Diagnosis not present

## 2019-06-06 DIAGNOSIS — Z23 Encounter for immunization: Secondary | ICD-10-CM | POA: Diagnosis not present

## 2019-06-06 DIAGNOSIS — I10 Essential (primary) hypertension: Secondary | ICD-10-CM | POA: Diagnosis not present

## 2019-06-17 DIAGNOSIS — H524 Presbyopia: Secondary | ICD-10-CM | POA: Diagnosis not present

## 2019-06-20 DIAGNOSIS — H43823 Vitreomacular adhesion, bilateral: Secondary | ICD-10-CM | POA: Diagnosis not present

## 2019-06-20 DIAGNOSIS — D3131 Benign neoplasm of right choroid: Secondary | ICD-10-CM | POA: Diagnosis not present

## 2019-06-20 DIAGNOSIS — H2513 Age-related nuclear cataract, bilateral: Secondary | ICD-10-CM | POA: Diagnosis not present

## 2019-06-30 DIAGNOSIS — E7849 Other hyperlipidemia: Secondary | ICD-10-CM | POA: Diagnosis not present

## 2019-06-30 DIAGNOSIS — R7301 Impaired fasting glucose: Secondary | ICD-10-CM | POA: Diagnosis not present

## 2019-06-30 DIAGNOSIS — K219 Gastro-esophageal reflux disease without esophagitis: Secondary | ICD-10-CM | POA: Diagnosis not present

## 2019-06-30 DIAGNOSIS — I10 Essential (primary) hypertension: Secondary | ICD-10-CM | POA: Diagnosis not present

## 2019-06-30 DIAGNOSIS — E782 Mixed hyperlipidemia: Secondary | ICD-10-CM | POA: Diagnosis not present

## 2019-08-07 DIAGNOSIS — S30860A Insect bite (nonvenomous) of lower back and pelvis, initial encounter: Secondary | ICD-10-CM | POA: Diagnosis not present

## 2019-08-07 DIAGNOSIS — E782 Mixed hyperlipidemia: Secondary | ICD-10-CM | POA: Diagnosis not present

## 2019-08-07 DIAGNOSIS — Z23 Encounter for immunization: Secondary | ICD-10-CM | POA: Diagnosis not present

## 2019-08-07 DIAGNOSIS — Z0000 Encounter for general adult medical examination without abnormal findings: Secondary | ICD-10-CM | POA: Diagnosis not present

## 2019-08-07 DIAGNOSIS — R7301 Impaired fasting glucose: Secondary | ICD-10-CM | POA: Diagnosis not present

## 2019-08-07 DIAGNOSIS — I10 Essential (primary) hypertension: Secondary | ICD-10-CM | POA: Diagnosis not present

## 2019-08-07 DIAGNOSIS — K219 Gastro-esophageal reflux disease without esophagitis: Secondary | ICD-10-CM | POA: Diagnosis not present

## 2019-08-07 DIAGNOSIS — R7303 Prediabetes: Secondary | ICD-10-CM | POA: Diagnosis not present

## 2019-08-07 DIAGNOSIS — H1045 Other chronic allergic conjunctivitis: Secondary | ICD-10-CM | POA: Diagnosis not present

## 2019-08-07 DIAGNOSIS — E7849 Other hyperlipidemia: Secondary | ICD-10-CM | POA: Diagnosis not present

## 2019-08-07 DIAGNOSIS — E6609 Other obesity due to excess calories: Secondary | ICD-10-CM | POA: Diagnosis not present

## 2019-08-07 DIAGNOSIS — E663 Overweight: Secondary | ICD-10-CM | POA: Diagnosis not present

## 2019-08-07 DIAGNOSIS — Z0001 Encounter for general adult medical examination with abnormal findings: Secondary | ICD-10-CM | POA: Diagnosis not present

## 2019-08-07 DIAGNOSIS — S80829A Blister (nonthermal), unspecified lower leg, initial encounter: Secondary | ICD-10-CM | POA: Diagnosis not present

## 2019-08-07 DIAGNOSIS — Z6828 Body mass index (BMI) 28.0-28.9, adult: Secondary | ICD-10-CM | POA: Diagnosis not present

## 2019-08-11 DIAGNOSIS — E663 Overweight: Secondary | ICD-10-CM | POA: Diagnosis not present

## 2019-08-11 DIAGNOSIS — R7301 Impaired fasting glucose: Secondary | ICD-10-CM | POA: Diagnosis not present

## 2019-08-11 DIAGNOSIS — H1045 Other chronic allergic conjunctivitis: Secondary | ICD-10-CM | POA: Diagnosis not present

## 2019-08-11 DIAGNOSIS — R7303 Prediabetes: Secondary | ICD-10-CM | POA: Diagnosis not present

## 2019-08-11 DIAGNOSIS — L409 Psoriasis, unspecified: Secondary | ICD-10-CM | POA: Diagnosis not present

## 2019-08-11 DIAGNOSIS — K219 Gastro-esophageal reflux disease without esophagitis: Secondary | ICD-10-CM | POA: Diagnosis not present

## 2019-08-11 DIAGNOSIS — K5900 Constipation, unspecified: Secondary | ICD-10-CM | POA: Diagnosis not present

## 2019-08-11 DIAGNOSIS — H6123 Impacted cerumen, bilateral: Secondary | ICD-10-CM | POA: Diagnosis not present

## 2019-08-11 DIAGNOSIS — E6609 Other obesity due to excess calories: Secondary | ICD-10-CM | POA: Diagnosis not present

## 2019-08-11 DIAGNOSIS — E782 Mixed hyperlipidemia: Secondary | ICD-10-CM | POA: Diagnosis not present

## 2019-08-11 DIAGNOSIS — Z96641 Presence of right artificial hip joint: Secondary | ICD-10-CM | POA: Diagnosis not present

## 2019-08-11 DIAGNOSIS — Z0001 Encounter for general adult medical examination with abnormal findings: Secondary | ICD-10-CM | POA: Diagnosis not present

## 2019-08-11 DIAGNOSIS — N529 Male erectile dysfunction, unspecified: Secondary | ICD-10-CM | POA: Diagnosis not present

## 2019-08-11 DIAGNOSIS — I10 Essential (primary) hypertension: Secondary | ICD-10-CM | POA: Diagnosis not present

## 2019-08-11 DIAGNOSIS — Z6829 Body mass index (BMI) 29.0-29.9, adult: Secondary | ICD-10-CM | POA: Diagnosis not present

## 2019-09-09 DIAGNOSIS — E782 Mixed hyperlipidemia: Secondary | ICD-10-CM | POA: Diagnosis not present

## 2019-09-09 DIAGNOSIS — R7301 Impaired fasting glucose: Secondary | ICD-10-CM | POA: Diagnosis not present

## 2019-09-09 DIAGNOSIS — K219 Gastro-esophageal reflux disease without esophagitis: Secondary | ICD-10-CM | POA: Diagnosis not present

## 2019-09-09 DIAGNOSIS — I10 Essential (primary) hypertension: Secondary | ICD-10-CM | POA: Diagnosis not present

## 2019-09-09 DIAGNOSIS — E7849 Other hyperlipidemia: Secondary | ICD-10-CM | POA: Diagnosis not present

## 2019-11-06 DIAGNOSIS — L570 Actinic keratosis: Secondary | ICD-10-CM | POA: Diagnosis not present

## 2019-11-06 DIAGNOSIS — D225 Melanocytic nevi of trunk: Secondary | ICD-10-CM | POA: Diagnosis not present

## 2019-11-06 DIAGNOSIS — X32XXXD Exposure to sunlight, subsequent encounter: Secondary | ICD-10-CM | POA: Diagnosis not present

## 2019-11-06 DIAGNOSIS — D485 Neoplasm of uncertain behavior of skin: Secondary | ICD-10-CM | POA: Diagnosis not present

## 2019-11-06 DIAGNOSIS — Z1283 Encounter for screening for malignant neoplasm of skin: Secondary | ICD-10-CM | POA: Diagnosis not present

## 2020-01-06 DIAGNOSIS — D3131 Benign neoplasm of right choroid: Secondary | ICD-10-CM | POA: Diagnosis not present

## 2020-01-06 DIAGNOSIS — H43823 Vitreomacular adhesion, bilateral: Secondary | ICD-10-CM | POA: Diagnosis not present

## 2020-01-06 DIAGNOSIS — H2513 Age-related nuclear cataract, bilateral: Secondary | ICD-10-CM | POA: Diagnosis not present

## 2020-04-06 DIAGNOSIS — L409 Psoriasis, unspecified: Secondary | ICD-10-CM | POA: Diagnosis not present

## 2020-04-06 DIAGNOSIS — Z0001 Encounter for general adult medical examination with abnormal findings: Secondary | ICD-10-CM | POA: Diagnosis not present

## 2020-04-06 DIAGNOSIS — Z0000 Encounter for general adult medical examination without abnormal findings: Secondary | ICD-10-CM | POA: Diagnosis not present

## 2020-04-06 DIAGNOSIS — I10 Essential (primary) hypertension: Secondary | ICD-10-CM | POA: Diagnosis not present

## 2020-04-06 DIAGNOSIS — R7301 Impaired fasting glucose: Secondary | ICD-10-CM | POA: Diagnosis not present

## 2020-04-06 DIAGNOSIS — N529 Male erectile dysfunction, unspecified: Secondary | ICD-10-CM | POA: Diagnosis not present

## 2020-04-06 DIAGNOSIS — E782 Mixed hyperlipidemia: Secondary | ICD-10-CM | POA: Diagnosis not present

## 2020-04-06 DIAGNOSIS — K219 Gastro-esophageal reflux disease without esophagitis: Secondary | ICD-10-CM | POA: Diagnosis not present

## 2020-04-06 DIAGNOSIS — H1045 Other chronic allergic conjunctivitis: Secondary | ICD-10-CM | POA: Diagnosis not present

## 2020-04-28 DIAGNOSIS — E663 Overweight: Secondary | ICD-10-CM | POA: Diagnosis not present

## 2020-04-28 DIAGNOSIS — K5900 Constipation, unspecified: Secondary | ICD-10-CM | POA: Diagnosis not present

## 2020-04-28 DIAGNOSIS — M25562 Pain in left knee: Secondary | ICD-10-CM | POA: Diagnosis not present

## 2020-04-28 DIAGNOSIS — R7303 Prediabetes: Secondary | ICD-10-CM | POA: Diagnosis not present

## 2020-04-28 DIAGNOSIS — Z6829 Body mass index (BMI) 29.0-29.9, adult: Secondary | ICD-10-CM | POA: Diagnosis not present

## 2020-04-28 DIAGNOSIS — Z96641 Presence of right artificial hip joint: Secondary | ICD-10-CM | POA: Diagnosis not present

## 2020-04-28 DIAGNOSIS — E782 Mixed hyperlipidemia: Secondary | ICD-10-CM | POA: Diagnosis not present

## 2020-04-28 DIAGNOSIS — I10 Essential (primary) hypertension: Secondary | ICD-10-CM | POA: Diagnosis not present

## 2020-06-08 ENCOUNTER — Other Ambulatory Visit (HOSPITAL_COMMUNITY): Payer: Self-pay | Admitting: Internal Medicine

## 2020-06-08 ENCOUNTER — Other Ambulatory Visit: Payer: Self-pay | Admitting: Internal Medicine

## 2020-06-08 DIAGNOSIS — Z87891 Personal history of nicotine dependence: Secondary | ICD-10-CM

## 2020-06-14 ENCOUNTER — Other Ambulatory Visit: Payer: Self-pay

## 2020-06-14 ENCOUNTER — Ambulatory Visit (HOSPITAL_COMMUNITY)
Admission: RE | Admit: 2020-06-14 | Discharge: 2020-06-14 | Disposition: A | Discharge status: Home / Self Care | Payer: Medicare HMO | Source: Ambulatory Visit | Attending: Internal Medicine | Admitting: Internal Medicine

## 2020-06-14 DIAGNOSIS — Z87891 Personal history of nicotine dependence: Secondary | ICD-10-CM | POA: Insufficient documentation

## 2020-07-06 DIAGNOSIS — H43811 Vitreous degeneration, right eye: Secondary | ICD-10-CM | POA: Diagnosis not present

## 2020-07-06 DIAGNOSIS — D3131 Benign neoplasm of right choroid: Secondary | ICD-10-CM | POA: Diagnosis not present

## 2020-07-06 DIAGNOSIS — H2513 Age-related nuclear cataract, bilateral: Secondary | ICD-10-CM | POA: Diagnosis not present

## 2020-07-06 DIAGNOSIS — H43823 Vitreomacular adhesion, bilateral: Secondary | ICD-10-CM | POA: Diagnosis not present

## 2020-07-12 DIAGNOSIS — E663 Overweight: Secondary | ICD-10-CM | POA: Diagnosis not present

## 2020-07-12 DIAGNOSIS — Z6829 Body mass index (BMI) 29.0-29.9, adult: Secondary | ICD-10-CM | POA: Diagnosis not present

## 2020-07-12 DIAGNOSIS — K5900 Constipation, unspecified: Secondary | ICD-10-CM | POA: Diagnosis not present

## 2020-07-12 DIAGNOSIS — M25562 Pain in left knee: Secondary | ICD-10-CM | POA: Diagnosis not present

## 2020-07-12 DIAGNOSIS — E782 Mixed hyperlipidemia: Secondary | ICD-10-CM | POA: Diagnosis not present

## 2020-07-12 DIAGNOSIS — I10 Essential (primary) hypertension: Secondary | ICD-10-CM | POA: Diagnosis not present

## 2020-07-12 DIAGNOSIS — Z96641 Presence of right artificial hip joint: Secondary | ICD-10-CM | POA: Diagnosis not present

## 2020-08-11 DIAGNOSIS — Z6829 Body mass index (BMI) 29.0-29.9, adult: Secondary | ICD-10-CM | POA: Diagnosis not present

## 2020-08-11 DIAGNOSIS — Z96641 Presence of right artificial hip joint: Secondary | ICD-10-CM | POA: Diagnosis not present

## 2020-08-11 DIAGNOSIS — E782 Mixed hyperlipidemia: Secondary | ICD-10-CM | POA: Diagnosis not present

## 2020-08-11 DIAGNOSIS — I10 Essential (primary) hypertension: Secondary | ICD-10-CM | POA: Diagnosis not present

## 2020-08-11 DIAGNOSIS — E663 Overweight: Secondary | ICD-10-CM | POA: Diagnosis not present

## 2020-08-11 DIAGNOSIS — M25562 Pain in left knee: Secondary | ICD-10-CM | POA: Diagnosis not present

## 2020-08-11 DIAGNOSIS — K5900 Constipation, unspecified: Secondary | ICD-10-CM | POA: Diagnosis not present

## 2020-10-07 DIAGNOSIS — H5213 Myopia, bilateral: Secondary | ICD-10-CM | POA: Diagnosis not present

## 2020-11-02 DIAGNOSIS — I10 Essential (primary) hypertension: Secondary | ICD-10-CM | POA: Diagnosis not present

## 2020-11-02 DIAGNOSIS — R7301 Impaired fasting glucose: Secondary | ICD-10-CM | POA: Diagnosis not present

## 2020-11-05 DIAGNOSIS — I10 Essential (primary) hypertension: Secondary | ICD-10-CM | POA: Diagnosis not present

## 2020-11-05 DIAGNOSIS — K5900 Constipation, unspecified: Secondary | ICD-10-CM | POA: Diagnosis not present

## 2020-11-05 DIAGNOSIS — Z96649 Presence of unspecified artificial hip joint: Secondary | ICD-10-CM | POA: Diagnosis not present

## 2020-11-05 DIAGNOSIS — E782 Mixed hyperlipidemia: Secondary | ICD-10-CM | POA: Diagnosis not present

## 2020-11-05 DIAGNOSIS — R7303 Prediabetes: Secondary | ICD-10-CM | POA: Diagnosis not present

## 2020-11-05 DIAGNOSIS — Z7689 Persons encountering health services in other specified circumstances: Secondary | ICD-10-CM | POA: Diagnosis not present

## 2020-11-05 DIAGNOSIS — M25562 Pain in left knee: Secondary | ICD-10-CM | POA: Diagnosis not present

## 2021-01-04 DIAGNOSIS — H43811 Vitreous degeneration, right eye: Secondary | ICD-10-CM | POA: Diagnosis not present

## 2021-01-04 DIAGNOSIS — H2513 Age-related nuclear cataract, bilateral: Secondary | ICD-10-CM | POA: Diagnosis not present

## 2021-01-04 DIAGNOSIS — D3131 Benign neoplasm of right choroid: Secondary | ICD-10-CM | POA: Diagnosis not present

## 2021-01-04 DIAGNOSIS — H43823 Vitreomacular adhesion, bilateral: Secondary | ICD-10-CM | POA: Diagnosis not present

## 2021-01-12 DIAGNOSIS — I10 Essential (primary) hypertension: Secondary | ICD-10-CM | POA: Diagnosis not present

## 2021-01-12 DIAGNOSIS — E782 Mixed hyperlipidemia: Secondary | ICD-10-CM | POA: Diagnosis not present

## 2021-02-01 ENCOUNTER — Telehealth: Payer: Self-pay | Admitting: Orthopedic Surgery

## 2021-02-01 NOTE — Telephone Encounter (Signed)
Patient called left a voicemail regarding scheduling an appointment for a shoulder problem he is having.  I called back and left him a voicemail to give [place] a call back to discuss scheduling an appointment with Dr. Aline Brochure.

## 2021-02-01 NOTE — Telephone Encounter (Signed)
[name] called back and we have scheduled him for Thursday, 02/10/21 at 3:20

## 2021-02-10 ENCOUNTER — Ambulatory Visit: Payer: Medicare HMO | Admitting: Orthopedic Surgery

## 2021-02-10 ENCOUNTER — Encounter: Payer: Self-pay | Admitting: Orthopedic Surgery

## 2021-02-10 ENCOUNTER — Other Ambulatory Visit: Payer: Self-pay

## 2021-02-10 ENCOUNTER — Ambulatory Visit: Payer: Medicare HMO

## 2021-02-10 VITALS — BP 153/98 | HR 81 | Ht 69.0 in | Wt 191.4 lb

## 2021-02-10 DIAGNOSIS — M25511 Pain in right shoulder: Secondary | ICD-10-CM | POA: Diagnosis not present

## 2021-02-10 DIAGNOSIS — G8929 Other chronic pain: Secondary | ICD-10-CM | POA: Diagnosis not present

## 2021-02-10 NOTE — Progress Notes (Signed)
Chief Complaint  Patient presents with   Shoulder Pain    Right shoulder/ pt fell 01/14/21    Wyatt Wilson is 70 years old he presents with acute onset of right shoulder pain after a fall.  He fell on or about September 2 landed on his right shoulder had severe pain at that time.  The pain is gotten a little bit less and his motion has improved but he still wanted the shoulder checked out because of persistent weakness  He reports no prior injuries to the right shoulder  Examination of the right shoulder shows tenderness but no bruising or ecchymosis he does have weakness compared to the left side in abduction and flexion passive range of motion is normal but painful he exhibits no evidence of shoulder dislocation lymph nodes are negative sensation is intact in the right upper extremity and the pulse and perfusion are normal  X-rays show no abnormalities  Recommend subacromial injection Home exercise program with weights for strengthening follow-up in 1 month  Subacromial injection right shoulder with Celestone and lidocaine.  We put 6 mg of Celestone and 4 cc of 1% lidocaine in the subacromial space the patient gave verbal consent Timeout confirmed the right shoulder as the injection site and we used a posterior approach using alcohol and ethyl chloride

## 2021-03-10 ENCOUNTER — Ambulatory Visit: Payer: Medicare HMO | Admitting: Orthopedic Surgery

## 2021-04-13 DIAGNOSIS — E782 Mixed hyperlipidemia: Secondary | ICD-10-CM | POA: Diagnosis not present

## 2021-04-13 DIAGNOSIS — I10 Essential (primary) hypertension: Secondary | ICD-10-CM | POA: Diagnosis not present

## 2021-04-19 ENCOUNTER — Encounter: Payer: Self-pay | Admitting: *Deleted

## 2021-05-04 ENCOUNTER — Encounter: Payer: Self-pay | Admitting: *Deleted

## 2021-05-05 DIAGNOSIS — I10 Essential (primary) hypertension: Secondary | ICD-10-CM | POA: Diagnosis not present

## 2021-05-05 DIAGNOSIS — R7301 Impaired fasting glucose: Secondary | ICD-10-CM | POA: Diagnosis not present

## 2021-05-05 DIAGNOSIS — Z125 Encounter for screening for malignant neoplasm of prostate: Secondary | ICD-10-CM | POA: Diagnosis not present

## 2021-05-11 ENCOUNTER — Other Ambulatory Visit: Payer: Self-pay | Admitting: Family Medicine

## 2021-05-11 ENCOUNTER — Other Ambulatory Visit (HOSPITAL_COMMUNITY): Payer: Self-pay | Admitting: Family Medicine

## 2021-05-11 DIAGNOSIS — L409 Psoriasis, unspecified: Secondary | ICD-10-CM | POA: Diagnosis not present

## 2021-05-11 DIAGNOSIS — M25562 Pain in left knee: Secondary | ICD-10-CM | POA: Diagnosis not present

## 2021-05-11 DIAGNOSIS — R7303 Prediabetes: Secondary | ICD-10-CM | POA: Diagnosis not present

## 2021-05-11 DIAGNOSIS — Z7689 Persons encountering health services in other specified circumstances: Secondary | ICD-10-CM | POA: Diagnosis not present

## 2021-05-11 DIAGNOSIS — Z96649 Presence of unspecified artificial hip joint: Secondary | ICD-10-CM | POA: Diagnosis not present

## 2021-05-11 DIAGNOSIS — Z0001 Encounter for general adult medical examination with abnormal findings: Secondary | ICD-10-CM | POA: Diagnosis not present

## 2021-05-11 DIAGNOSIS — I10 Essential (primary) hypertension: Secondary | ICD-10-CM | POA: Diagnosis not present

## 2021-05-11 DIAGNOSIS — E782 Mixed hyperlipidemia: Secondary | ICD-10-CM | POA: Diagnosis not present

## 2021-05-11 DIAGNOSIS — Z1211 Encounter for screening for malignant neoplasm of colon: Secondary | ICD-10-CM | POA: Diagnosis not present

## 2021-06-07 ENCOUNTER — Ambulatory Visit (HOSPITAL_COMMUNITY): Payer: Medicare HMO

## 2021-06-21 ENCOUNTER — Ambulatory Visit (INDEPENDENT_AMBULATORY_CARE_PROVIDER_SITE_OTHER): Payer: Self-pay | Admitting: *Deleted

## 2021-06-21 ENCOUNTER — Other Ambulatory Visit: Payer: Self-pay

## 2021-06-21 VITALS — Ht 69.0 in | Wt 190.0 lb

## 2021-06-21 DIAGNOSIS — Z86010 Personal history of colonic polyps: Secondary | ICD-10-CM

## 2021-06-21 NOTE — Progress Notes (Signed)
Pt takes Iron 3 days before platelet donation.  Does this every 2 weeks.

## 2021-06-21 NOTE — Progress Notes (Signed)
Gastroenterology Pre-Procedure Review  Request Date: 06/21/2021 Requesting Physician: 5 year recall, Last TCS done 04/24/2016 by Dr. Gala Romney, colon polyp-vegetable material, hyperplastic polyp  PATIENT REVIEW QUESTIONS: The patient responded to the following health history questions as indicated:    1. Diabetes Melitis: no 2. Joint replacements in the past 12 months: no 3. Major health problems in the past 3 months: no 4. Has an artificial valve or MVP: no 5. Has a defibrillator: no 6. Has been advised in past to take antibiotics in advance of a procedure like teeth cleaning: yes, years ago but doesn't have to do it anymore 7. Family history of colon cancer: no 8. Alcohol Use: no 9. Illicit drug Use: no 10. History of sleep apnea: no 11. History of coronary artery or other vascular stents placed within the last 12 months: no 12. History of any prior anesthesia complications: no 13. Body mass index is 28.06 kg/m.    MEDICATIONS & ALLERGIES:    Patient reports the following regarding taking any blood thinners:   Plavix? no Aspirin? Yes, 81 mg daily, stops when using Iron  Coumadin? no Brilinta? no Xarelto? no Eliquis? no Pradaxa? no Savaysa? no Effient? no  Patient confirms/reports the following medications:  Current Outpatient Medications  Medication Sig Dispense Refill   amLODipine (NORVASC) 5 MG tablet Take 5 mg by mouth at bedtime.      aspirin 81 MG chewable tablet Chew 1 tablet (81 mg total) by mouth 2 (two) times daily. (Patient taking differently: Chew 81 mg by mouth daily at 6 (six) AM.) 35 tablet 0   Crisaborole 2 % OINT Apply 1 application topically daily as needed (for psoriasis).     lisinopril-hydrochlorothiazide (PRINZIDE,ZESTORETIC) 20-25 MG per tablet Take 1 tablet by mouth daily.     pravastatin (PRAVACHOL) 20 MG tablet Take 20 mg by mouth at bedtime.      No current facility-administered medications for this visit.    Patient confirms/reports the following  allergies:  No Known Allergies  No orders of the defined types were placed in this encounter.   AUTHORIZATION INFORMATION Primary Insurance: [facility],  ID #: [id number],  Group #: [id number] Pre-Cert / Josem Kaufmann required:  Pre-Cert / Auth #:   SCHEDULE INFORMATION: Procedure has been scheduled as follows:  Date: , Time:   Location: [facility] with Dr. Gala Romney  This Gastroenterology Pre-Precedure Review Form is being routed to the following provider(s): Roseanne Kaufman, NP

## 2021-06-29 NOTE — Progress Notes (Signed)
ASA 2. No iron 10 days before procedure.

## 2021-06-30 NOTE — Progress Notes (Signed)
Spoke to pt.  He was informed that I will be back in touch with him once Dr. Roseanne Kaufman procedure schedules are available.

## 2021-06-30 NOTE — Progress Notes (Signed)
Lmom for pt to call me back. 

## 2021-07-05 DIAGNOSIS — H2513 Age-related nuclear cataract, bilateral: Secondary | ICD-10-CM | POA: Diagnosis not present

## 2021-07-05 DIAGNOSIS — H43823 Vitreomacular adhesion, bilateral: Secondary | ICD-10-CM | POA: Diagnosis not present

## 2021-07-05 DIAGNOSIS — D3131 Benign neoplasm of right choroid: Secondary | ICD-10-CM | POA: Diagnosis not present

## 2021-07-05 DIAGNOSIS — H43813 Vitreous degeneration, bilateral: Secondary | ICD-10-CM | POA: Diagnosis not present

## 2021-07-15 ENCOUNTER — Other Ambulatory Visit: Payer: Self-pay

## 2021-07-15 ENCOUNTER — Ambulatory Visit (HOSPITAL_COMMUNITY)
Admission: RE | Admit: 2021-07-15 | Discharge: 2021-07-15 | Disposition: A | Discharge status: Home / Self Care | Payer: Medicare HMO | Source: Ambulatory Visit | Attending: Family Medicine | Admitting: Family Medicine

## 2021-07-15 DIAGNOSIS — Z7689 Persons encountering health services in other specified circumstances: Secondary | ICD-10-CM | POA: Insufficient documentation

## 2021-07-15 DIAGNOSIS — I2510 Atherosclerotic heart disease of native coronary artery without angina pectoris: Secondary | ICD-10-CM | POA: Diagnosis not present

## 2021-07-15 DIAGNOSIS — Z122 Encounter for screening for malignant neoplasm of respiratory organs: Secondary | ICD-10-CM | POA: Insufficient documentation

## 2021-07-15 DIAGNOSIS — J439 Emphysema, unspecified: Secondary | ICD-10-CM | POA: Insufficient documentation

## 2021-07-15 DIAGNOSIS — I700 Atherosclerosis of aorta: Secondary | ICD-10-CM | POA: Insufficient documentation

## 2021-07-15 DIAGNOSIS — Z87891 Personal history of nicotine dependence: Secondary | ICD-10-CM | POA: Insufficient documentation

## 2021-07-18 NOTE — Progress Notes (Signed)
Lmom for pt to call me back. 

## 2021-07-26 ENCOUNTER — Encounter: Payer: Self-pay | Admitting: *Deleted

## 2021-07-27 ENCOUNTER — Telehealth: Payer: Self-pay | Admitting: Internal Medicine

## 2021-07-27 NOTE — Telephone Encounter (Signed)
Lmom for pt to call me back. 

## 2021-07-27 NOTE — Telephone Encounter (Signed)
Patient returned call

## 2021-07-28 ENCOUNTER — Encounter: Payer: Self-pay | Admitting: *Deleted

## 2021-07-28 ENCOUNTER — Other Ambulatory Visit: Payer: Self-pay | Admitting: *Deleted

## 2021-07-28 MED ORDER — PEG 3350-KCL-NA BICARB-NACL 420 G PO SOLR: 4000.0000 mL | Freq: Once | ORAL | 0 refills | Status: AC

## 2021-07-28 NOTE — Progress Notes (Signed)
Pt made aware to hold Iron on 07/29/2021 until after scheduled procedure.

## 2021-07-28 NOTE — Telephone Encounter (Signed)
Tried to call pt again.  LMOM for pt to call me back. ?

## 2021-08-01 ENCOUNTER — Telehealth: Payer: Self-pay | Admitting: Internal Medicine

## 2021-08-01 NOTE — Telephone Encounter (Signed)
Spoke to pt.  He did not want to reschedule his procedure.  He just wanted to review all instructions.  Answered all questions. ?

## 2021-08-01 NOTE — Telephone Encounter (Signed)
Patient called to reschedule his procedure  ?

## 2021-08-05 ENCOUNTER — Other Ambulatory Visit (HOSPITAL_COMMUNITY)
Admission: RE | Admit: 2021-08-05 | Discharge: 2021-08-05 | Disposition: A | Discharge status: Home / Self Care | Payer: Medicare HMO | Source: Ambulatory Visit | Attending: Internal Medicine | Admitting: Internal Medicine

## 2021-08-05 DIAGNOSIS — Z86010 Personal history of colonic polyps: Secondary | ICD-10-CM | POA: Insufficient documentation

## 2021-08-05 LAB — BASIC METABOLIC PANEL
Anion gap: 7 (ref 5–15)
BUN: 17 mg/dL (ref 8–23)
CO2: 30 mmol/L (ref 22–32)
Calcium: 9 mg/dL (ref 8.9–10.3)
Chloride: 101 mmol/L (ref 98–111)
Creatinine, Ser: 0.85 mg/dL (ref 0.61–1.24)
GFR, Estimated: >60 mL/min (ref >60)
Glucose, Bld: 107 mg/dL — ABNORMAL HIGH (ref 70–99)
Potassium: 3.4 mmol/L — ABNORMAL LOW (ref 3.5–5.1)
Sodium: 138 mmol/L (ref 135–145)

## 2021-08-08 ENCOUNTER — Ambulatory Visit (HOSPITAL_COMMUNITY): Payer: Medicare HMO | Admitting: Anesthesiology

## 2021-08-08 ENCOUNTER — Other Ambulatory Visit: Payer: Self-pay

## 2021-08-08 ENCOUNTER — Ambulatory Visit (HOSPITAL_COMMUNITY)
Admission: RE | Admit: 2021-08-08 | Discharge: 2021-08-08 | Disposition: A | Discharge status: Home / Self Care | Payer: Medicare HMO | Attending: Internal Medicine | Admitting: Internal Medicine

## 2021-08-08 ENCOUNTER — Ambulatory Visit (HOSPITAL_BASED_OUTPATIENT_CLINIC_OR_DEPARTMENT_OTHER): Payer: Medicare HMO | Admitting: Anesthesiology

## 2021-08-08 ENCOUNTER — Encounter (HOSPITAL_COMMUNITY)
Admission: RE | Disposition: A | Discharge status: Home / Self Care | Payer: Self-pay | Source: Home / Self Care | Attending: Internal Medicine

## 2021-08-08 ENCOUNTER — Encounter (HOSPITAL_COMMUNITY): Payer: Self-pay | Admitting: Internal Medicine

## 2021-08-08 DIAGNOSIS — I10 Essential (primary) hypertension: Secondary | ICD-10-CM

## 2021-08-08 DIAGNOSIS — Z86010 Personal history of colonic polyps: Secondary | ICD-10-CM

## 2021-08-08 DIAGNOSIS — K635 Polyp of colon: Secondary | ICD-10-CM | POA: Diagnosis not present

## 2021-08-08 DIAGNOSIS — K219 Gastro-esophageal reflux disease without esophagitis: Secondary | ICD-10-CM | POA: Diagnosis not present

## 2021-08-08 DIAGNOSIS — D122 Benign neoplasm of ascending colon: Secondary | ICD-10-CM | POA: Diagnosis not present

## 2021-08-08 DIAGNOSIS — D127 Benign neoplasm of rectosigmoid junction: Secondary | ICD-10-CM

## 2021-08-08 DIAGNOSIS — K5730 Diverticulosis of large intestine without perforation or abscess without bleeding: Secondary | ICD-10-CM | POA: Diagnosis not present

## 2021-08-08 DIAGNOSIS — Z87891 Personal history of nicotine dependence: Secondary | ICD-10-CM | POA: Insufficient documentation

## 2021-08-08 DIAGNOSIS — Z1211 Encounter for screening for malignant neoplasm of colon: Secondary | ICD-10-CM | POA: Diagnosis not present

## 2021-08-08 HISTORY — PX: POLYPECTOMY: SHX5525

## 2021-08-08 HISTORY — PX: COLONOSCOPY WITH PROPOFOL: SHX5780

## 2021-08-08 SURGERY — COLONOSCOPY WITH PROPOFOL
Anesthesia: General

## 2021-08-08 MED ORDER — PROPOFOL 10 MG/ML IV BOLUS
INTRAVENOUS | Status: DC | PRN
  Administered 2021-08-08: 60 mg via INTRAVENOUS

## 2021-08-08 MED ORDER — STERILE WATER FOR IRRIGATION IR SOLN
Status: DC | PRN
  Administered 2021-08-08: 100 mL

## 2021-08-08 MED ORDER — PROPOFOL 500 MG/50ML IV EMUL
INTRAVENOUS | Status: DC | PRN
  Administered 2021-08-08: 150 ug/kg/min via INTRAVENOUS

## 2021-08-08 MED ORDER — PHENYLEPHRINE HCL (PRESSORS) 10 MG/ML IV SOLN
INTRAVENOUS | Status: DC | PRN
  Administered 2021-08-08 (×2): 40 ug via INTRAVENOUS

## 2021-08-08 MED ORDER — LACTATED RINGERS IV SOLN: INTRAVENOUS | Status: DC

## 2021-08-08 NOTE — Discharge Instructions (Addendum)
?  Colonoscopy ?Discharge Instructions ? ?Read the instructions outlined below and refer to this sheet in the next few weeks. These discharge instructions provide you with general information on caring for yourself after you leave the hospital. Your doctor may also give you specific instructions. While your treatment has been planned according to the most current medical practices available, unavoidable complications occasionally occur. If you have any problems or questions after discharge, call Dr. Gala Romney at [phone]. ?ACTIVITY ?You may resume your regular activity, but move at a slower pace for the next 24 hours.  ?Take frequent rest periods for the next 24 hours.  ?Walking will help get rid of the air and reduce the bloated feeling in your belly (abdomen).  ?No driving for 24 hours (because of the medicine (anesthesia) used during the test).   ?Do not sign any important legal documents or operate any machinery for 24 hours (because of the anesthesia used during the test).  ?NUTRITION ?Drink plenty of fluids.  ?You may resume your normal diet as instructed by your doctor.  ?Begin with a light meal and progress to your normal diet. Heavy or fried foods are harder to digest and may make you feel sick to your stomach (nauseated).  ?Avoid alcoholic beverages for 24 hours or as instructed.  ?MEDICATIONS ?You may resume your normal medications unless your doctor tells you otherwise.  ?WHAT YOU CAN EXPECT TODAY ?Some feelings of bloating in the abdomen.  ?Passage of more gas than usual.  ?Spotting of blood in your stool or on the toilet paper.  ?IF YOU HAD POLYPS REMOVED DURING THE COLONOSCOPY: ?No aspirin products for 7 days or as instructed.  ?No alcohol for 7 days or as instructed.  ?Eat a soft diet for the next 24 hours.  ?FINDING OUT THE RESULTS OF YOUR TEST ?Not all test results are available during your visit. If your test results are not back during the visit, make an appointment with your caregiver to find out the  results. Do not assume everything is normal if you have not heard from your caregiver or the medical facility. It is important for you to follow up on all of your test results.  ?SEEK IMMEDIATE MEDICAL ATTENTION IF: ?You have more than a spotting of blood in your stool.  ?Your belly is swollen (abdominal distention).  ?You are nauseated or vomiting.  ?You have a temperature over 101.  ?You have abdominal pain or discomfort that is severe or gets worse throughout the day.   ? ?2 polyps removed in your colon today ? ?Diverticulosis and polyp information provided ? ?Your colonoscopy preparation was relatively poor today ? ?Further recommendations to follow pending review of pathology report ? ? ? ?

## 2021-08-08 NOTE — H&P (Signed)
$'@LOGO'l$ @ ? ? ?Primary Care Physician:  Celene Squibb, MD ?Primary Gastroenterologist:  Dr. Gala Romney ? ?Pre-Procedure History & Physical: ?HPI:  Wyatt Wilson is a 71 y.o. male here for surveillance colonoscopy.  Distant history colonic adenoma hyperplastic polyp removed 6 years ago.  No GI symptoms. ? ?Past Medical History:  ?Diagnosis Date  ? Arthritis   ? "took it out of my right hip" (04/23/2018)  ? GERD (gastroesophageal reflux disease)   ? "occasionally" (04/23/2018)  ? Hypercholesterolemia   ? Hypertension   ? ? ?Past Surgical History:  ?Procedure Laterality Date  ? COLONOSCOPY  07/19/2011  ? Procedure: COLONOSCOPY;  Surgeon: Daneil Dolin, MD;  [facility];  Service: Endoscopy;  Laterality: N/A;  9:00 AM; RESULTS: Mix of tubular adenoma and hyperplastic polyps.  ? COLONOSCOPY N/A 04/24/2016  ? Procedure: COLONOSCOPY;  Surgeon: Daneil Dolin, MD;  Location: [facility];  Service: Endoscopy;  Laterality: N/A;  245  ? CYSTECTOMY  1988  ? Cyst removed from back  ? JOINT REPLACEMENT    ? TOTAL HIP ARTHROPLASTY Right 04/23/2018  ? TOTAL HIP ARTHROPLASTY Right 04/23/2018  ? Procedure: RIGHT TOTAL HIP ARTHROPLASTY ANTERIOR APPROACH;  Surgeon: Mcarthur Rossetti, MD;  Location: [facility];  Service: Orthopedics;  Laterality: Right;  ? ? ?Prior to Admission medications   ?Medication Sig Start Date End Date Taking? Authorizing Provider  ?amLODipine (NORVASC) 5 MG tablet Take 5 mg by mouth at bedtime.    Yes [provider]  ?aspirin 81 MG chewable tablet Chew 1 tablet (81 mg total) by mouth 2 (two) times daily. ?Patient taking differently: Chew 81 mg by mouth in the morning. 04/25/18  Yes Pete Pelt, PA-C  ?docusate sodium (COLACE) 100 MG capsule Take 100 mg by mouth daily as needed for mild constipation or moderate constipation.   Yes [provider]  ?Ferrous Sulfate (IRON) 325 (65 Fe) MG TABS Take 352 mg by mouth as needed (Takes 3 days before platelet donation.  Does this every 2  weeks.).   Yes [provider]  ?lisinopril-hydrochlorothiazide (PRINZIDE,ZESTORETIC) 20-25 MG per tablet Take 1 tablet by mouth daily.   Yes [provider]  ?pravastatin (PRAVACHOL) 20 MG tablet Take 20 mg by mouth at bedtime.    Yes [provider]  ?triamcinolone cream (KENALOG) 0.1 % Apply 1 application. topically daily as needed (Psoriasis). 05/11/21  Yes [provider]  ? ? ?Allergies as of 07/28/2021  ? (No Known Allergies)  ? ? ?Family History  ?Problem Relation Age of Onset  ? Alzheimer's disease Father   ? Colon cancer Neg Hx   ? Colon polyps Neg Hx   ? ? ?Social History  ? ?Socioeconomic History  ? Marital status: Divorced  ?  Spouse name: Not on file  ? Number of children: Not on file  ? Years of education: Not on file  ? Highest education level: Not on file  ?Occupational History  ? Not on file  ?Tobacco Use  ? Smoking status: Former  ?  Packs/day: 1.50  ?  Years: 30.00  ?  Pack years: 45.00  ?  Types: Cigarettes  ?  Quit date: 03/27/2013  ?  Years since quitting: 8.3  ? Smokeless tobacco: Never  ?Vaping Use  ? Vaping Use: Never used  ?Substance and Sexual Activity  ? Alcohol use: Yes  ?  Comment: 04/23/2018 "1 beer q few months"  ? Drug use: No  ? Sexual activity: Not Currently  ?  Other Topics Concern  ? Not on file  ?Social History Narrative  ? Not on file  ? ?Social Determinants of Health  ? ?Financial Resource Strain: Not on file  ?Food Insecurity: Not on file  ?Transportation Needs: Not on file  ?Physical Activity: Not on file  ?Stress: Not on file  ?Social Connections: Not on file  ?Intimate Partner Violence: Not on file  ? ? ?Review of Systems: ?See HPI, otherwise negative ROS ? ?Physical Exam: ?BP 133/87   Pulse 77 Comment: WITH PAC's  Temp 97.9 ?F (36.6 ?C) (Oral)   Resp 16   Ht '5\' 9"'$  (1.753 m)   Wt 86.2 kg   SpO2 99%   BMI 28.06 kg/m?  ?General:   Alert,  Well-developed, well-nourished, pleasant and cooperative in NAD ?Mouth:  No deformity or  lesions. ?Neck:  Supple; no masses or thyromegaly. No significant cervical adenopathy. ?Lungs:  Clear throughout to auscultation.   No wheezes, crackles, or rhonchi. No acute distress. ?Heart:  Regular rate and rhythm; no murmurs, clicks, rubs,  or gallops. ?Abdomen: Non-distended, normal bowel sounds.  Soft and nontender without appreciable mass or hepatosplenomegaly.  ?Pulses:  Normal pulses noted. ?Extremities:  Without clubbing or edema. ? ?Impression/Plan: 71 year old gentleman with a history of colonic adenoma here for surveillance colonoscopy per plan. ? ?I have offered the patient a surveillance colonoscopy. ?The risks, benefits, limitations, alternatives and imponderables have been reviewed with the patient. Questions have been answered. All parties are agreeable.   ? ? ? ? ?Notice: This dictation was prepared with Dragon dictation along with smaller phrase technology. Any transcriptional errors that result from this process are unintentional and may not be corrected upon review.  ? ?

## 2021-08-08 NOTE — Anesthesia Preprocedure Evaluation (Signed)
Anesthesia Evaluation  Patient identified by MRN, date of birth, ID band Patient awake    Reviewed: Allergy & Precautions, H&P , NPO status , Patient's Chart, lab work & pertinent test results, reviewed documented beta blocker date and time   Airway Mallampati: II  TM Distance: >3 FB Neck ROM: full    Dental no notable dental hx.    Pulmonary neg pulmonary ROS, former smoker,    Pulmonary exam normal breath sounds clear to auscultation       Cardiovascular Exercise Tolerance: Good hypertension, negative cardio ROS   Rhythm:regular Rate:Normal     Neuro/Psych negative neurological ROS  negative psych ROS   GI/Hepatic Neg liver ROS, GERD  Medicated,  Endo/Other  negative endocrine ROS  Renal/GU negative Renal ROS  negative genitourinary   Musculoskeletal   Abdominal   Peds  Hematology negative hematology ROS (+)   Anesthesia Other Findings   Reproductive/Obstetrics negative OB ROS                             Anesthesia Physical Anesthesia Plan  ASA: 2  Anesthesia Plan: General   Post-op Pain Management:    Induction:   PONV Risk Score and Plan: Propofol infusion  Airway Management Planned:   Additional Equipment:   Intra-op Plan:   Post-operative Plan:   Informed Consent: I have reviewed the patients History and Physical, chart, labs and discussed the procedure including the risks, benefits and alternatives for the proposed anesthesia with the patient or authorized representative who has indicated his/her understanding and acceptance.     Dental Advisory Given  Plan Discussed with: CRNA  Anesthesia Plan Comments:         Anesthesia Quick Evaluation  

## 2021-08-08 NOTE — Op Note (Signed)
[facility], [place] ?Patient Name: Wyatt Wilson ?Procedure Date: 08/08/2021 12:08 PM ?MRN: [id number] ?Date of Birth: 06-29-1950 ?Attending MD: Norvel Richards , MD ?CSN: [id number] ?Age: 71 ?Admit Type: Outpatient ?Procedure:                Colonoscopy ?Indications:              High risk colon cancer surveillance: Personal  ?                          history of colonic polyps ?Providers:                Norvel Richards, MD, Charlsie Quest Theda Sers RN, Therapist, sports,  ?                          Randa Spike, Technician ?Referring MD:              ?Medicines:                Propofol per Anesthesia ?Complications:            No immediate complications. ?Estimated Blood Loss:     Estimated blood loss was minimal. ?Procedure:                Pre-Anesthesia Assessment: ?                          - Prior to the procedure, a History and Physical  ?                          was performed, and patient medications and  ?                          allergies were reviewed. The patient's tolerance of  ?                          previous anesthesia was also reviewed. The risks  ?                          and benefits of the procedure and the sedation  ?                          options and risks were discussed with the patient.  ?                          All questions were answered, and informed consent  ?                          was obtained. Prior Anticoagulants: The patient has  ?                          taken no previous anticoagulant or antiplatelet  ?                          agents. ASA Grade Assessment: II - A patient with  ?  mild systemic disease. After reviewing the risks  ?                          and benefits, the patient was deemed in  ?                          satisfactory condition to undergo the procedure. ?                          After obtaining informed consent, the colonoscope  ?                          was passed under direct vision. Throughout the  ?                          procedure,  the patient's blood pressure, pulse, and  ?                          oxygen saturations were monitored continuously. The  ?                          [id number]) scope was introduced through  ?                          the anus and advanced to the the cecum, identified  ?                          by appendiceal orifice and ileocecal valve. The  ?                          colonoscopy was performed without difficulty. The  ?                          patient tolerated the procedure well. The quality  ?                          of the bowel preparation was inadequate. ?Scope In: 12:45:05 PM ?Scope Out: 1:09:32 PM ?Scope Withdrawal Time: 0 hours 16 minutes 22 seconds  ?Total Procedure Duration: 0 hours 24 minutes 27 seconds  ?Findings: ?     The perianal and digital rectal examinations were normal. ?     Scattered small-mouthed diverticula were found in the sigmoid colon and  ?     descending colon. 2 cm soft pale ascending colon nodule. Positive pillow  ?     sign. ?     Two semi-pedunculated polyps were found in the recto-sigmoid colon and  ?     ascending colon. The polyps were 4 to 7 mm in size. These polyps were  ?     removed with a cold snare. Resection and retrieval were complete.  ?     Estimated blood loss was minimal. ?     The exam was otherwise without abnormality on direct and retroflexion  ?     views. ?Impression:               - Preparation of the colon was inadequate. ?                          -  Diverticulosis in the sigmoid colon and in the  ?                          descending colon. Colonic lipoma ?                          - Two 4 to 7 mm polyps at the recto-sigmoid colon  ?                          and in the ascending colon, removed with a cold  ?                          snare. Resected and retrieved. ?                          - The examination was otherwise normal on direct  ?                          and retroflexion views. Inadequate preparation  ?                           compromised examination. ?Moderate Sedation: ?     Moderate (conscious) sedation was personally administered by an  ?     anesthesia professional. The following parameters were monitored: oxygen  ?     saturation, heart rate, blood pressure, respiratory rate, EKG, adequacy  ?     of pulmonary ventilation, and response to care. ?Recommendation:           - Patient has a contact number available for  ?                          emergencies. The signs and symptoms of potential  ?                          delayed complications were discussed with the  ?                          patient. Return to normal activities tomorrow.  ?                          Written discharge instructions were provided to the  ?                          patient. ?                          - Resume previous diet. ?                          - Continue present medications. ?                          - Repeat colonoscopy date to be determined after  ?                          pending pathology  results are reviewed for  ?                          surveillance. ?                          - Return to GI office (date not yet determined). ?Procedure Code(s):        --- Professional --- ?                          [phone], Colonoscopy, flexible; with removal of  ?                          tumor(s), polyp(s), or other lesion(s) by snare  ?                          technique ?Diagnosis Code(s):        --- Professional --- ?                          Z86.010, Personal history of colonic polyps ?                          K63.5, Polyp of colon ?                          K57.30, Diverticulosis of large intestine without  ?                          perforation or abscess without bleeding ?CPT copyright 2019 American Medical Association. All rights reserved. ?The codes documented in this report are preliminary and upon coder review may  ?be revised to meet current compliance requirements. ?Cristopher Estimable. Tessica Cupo, MD ?Norvel Richards, MD ?08/08/2021 1:19:42 PM ?This  report has been signed electronically. ?Number of Addenda: 0 ?

## 2021-08-08 NOTE — Transfer of Care (Signed)
Immediate Anesthesia Transfer of Care Note ? ?Patient: Wyatt Wilson ? ?Procedure(s) Performed: COLONOSCOPY WITH PROPOFOL ?POLYPECTOMY ? ?Patient Location: PACU ? ?Anesthesia Type:General ? ?Level of Consciousness: awake, alert  and oriented ? ?Airway & Oxygen Therapy: Patient Spontanous Breathing ? ?Post-op Assessment: Report given to RN, Post -op Vital signs reviewed and stable and Patient moving all extremities X 4 ? ?Post vital signs: Reviewed ? ?Last Vitals:  ?Vitals Value Taken Time  ?BP 113/62   ?Temp 97.2   ?Pulse 59   ?Resp 12   ?SpO2 97   ? ? ?Last Pain:  ?Vitals:  ? 08/08/21 1244  ?TempSrc:   ?PainSc: 0-No pain  ?   ? ?Patients Stated Pain Goal: 6 (08/08/21 1123) ? ?Complications: No notable events documented. ?

## 2021-08-09 NOTE — Anesthesia Postprocedure Evaluation (Signed)
Anesthesia Post Note ? ?Patient: Wyatt Wilson ? ?Procedure(s) Performed: COLONOSCOPY WITH PROPOFOL ?POLYPECTOMY ? ?Patient location during evaluation: Phase II ?Anesthesia Type: General ?Level of consciousness: awake ?Pain management: pain level controlled ?Vital Signs Assessment: post-procedure vital signs reviewed and stable ?Respiratory status: spontaneous breathing and respiratory function stable ?Cardiovascular status: blood pressure returned to baseline and stable ?Postop Assessment: no headache and no apparent nausea or vomiting ?Anesthetic complications: no ?Comments: Late entry ? ? ?No notable events documented. ? ? ?Last Vitals:  ?Vitals:  ? 08/08/21 1123 08/08/21 1313  ?BP: 133/87 113/62  ?Pulse: 77 (!) 54  ?Resp: 16 18  ?Temp: 36.6 ?C 36.6 ?C  ?SpO2: 99% 97%  ?  ?Last Pain:  ?Vitals:  ? 08/08/21 1313  ?TempSrc: Oral  ?PainSc: 0-No pain  ? ? ?  ?  ?  ?  ?  ?  ? ?Louann Sjogren ? ? ? ? ?

## 2021-08-11 DIAGNOSIS — K006 Disturbances in tooth eruption: Secondary | ICD-10-CM | POA: Diagnosis not present

## 2021-08-11 LAB — SURGICAL PATHOLOGY

## 2021-08-12 ENCOUNTER — Encounter (HOSPITAL_COMMUNITY): Payer: Self-pay | Admitting: Internal Medicine

## 2021-08-12 ENCOUNTER — Encounter: Payer: Self-pay | Admitting: Internal Medicine

## 2021-11-01 DIAGNOSIS — R7303 Prediabetes: Secondary | ICD-10-CM | POA: Diagnosis not present

## 2021-11-01 DIAGNOSIS — R7301 Impaired fasting glucose: Secondary | ICD-10-CM | POA: Diagnosis not present

## 2021-11-01 DIAGNOSIS — E782 Mixed hyperlipidemia: Secondary | ICD-10-CM | POA: Diagnosis not present

## 2021-11-01 DIAGNOSIS — I10 Essential (primary) hypertension: Secondary | ICD-10-CM | POA: Diagnosis not present

## 2021-11-08 DIAGNOSIS — R0789 Other chest pain: Secondary | ICD-10-CM | POA: Diagnosis not present

## 2021-11-08 DIAGNOSIS — Z7689 Persons encountering health services in other specified circumstances: Secondary | ICD-10-CM | POA: Diagnosis not present

## 2021-11-08 DIAGNOSIS — Z96649 Presence of unspecified artificial hip joint: Secondary | ICD-10-CM | POA: Diagnosis not present

## 2021-11-08 DIAGNOSIS — E782 Mixed hyperlipidemia: Secondary | ICD-10-CM | POA: Diagnosis not present

## 2021-11-08 DIAGNOSIS — I10 Essential (primary) hypertension: Secondary | ICD-10-CM | POA: Diagnosis not present

## 2021-11-08 DIAGNOSIS — R7303 Prediabetes: Secondary | ICD-10-CM | POA: Diagnosis not present

## 2021-11-08 DIAGNOSIS — L409 Psoriasis, unspecified: Secondary | ICD-10-CM | POA: Diagnosis not present

## 2021-11-14 DIAGNOSIS — H524 Presbyopia: Secondary | ICD-10-CM | POA: Diagnosis not present

## 2021-12-06 ENCOUNTER — Encounter: Payer: Self-pay | Admitting: Internal Medicine

## 2021-12-06 ENCOUNTER — Ambulatory Visit (INDEPENDENT_AMBULATORY_CARE_PROVIDER_SITE_OTHER): Payer: Medicare HMO | Admitting: Internal Medicine

## 2021-12-06 VITALS — BP 122/78 | HR 58 | Resp 20 | Ht 69.0 in | Wt 191.6 lb

## 2021-12-06 DIAGNOSIS — R0789 Other chest pain: Secondary | ICD-10-CM

## 2021-12-06 NOTE — Progress Notes (Signed)
OFFICE CONSULT NOTE  Chief Complaint:  "Feeling of a pebble rolling in my chest"  Primary Care Physician: Wyatt Squibb, MD  HPI:  Wyatt Wilson is a 71 y.o. male who is being seen today for the evaluation of atypical chest pain at the request of Wyatt Wilson, [place].  This is a 71 year old male kindly referred for evaluation of possible cardiac chest pain.  He describes a feeling of a pebble rolling in his chest.  He pointed to the mid right clavicular line and indicated with his fingertip a discomfort moving along the lower rib border toward the sternum on the right chest.  He said he has these episodes that occur without exertion and are not relieved by rest.  They occur randomly and happened twice in the past year.  He was concerned about a cardiac chest pain.  He does not report any significant family history of early onset heart disease and said that his mother lived to be 69.  He does have some risk factors for heart disease including dyslipidemia and hypertension which are well managed.  He did have an EKG prior to hip surgery back in 2019 which showed some scooped ST segments as well as PACs.  A repeat EKG today showed sinus bradycardia at 58 with scooped ST segments but no extra beats.  He says he is very active.  After his hip surgery he has been able to get around very well and works at [employer] and climbs ladders and is very physically active.  He has no chest pain or shortness of breath or other symptoms associated with this.  PMHx:  Past Medical History:  Diagnosis Date   Arthritis    "took it out of my right hip" (04/23/2018)   GERD (gastroesophageal reflux disease)    "occasionally" (04/23/2018)   Hypercholesterolemia    Hypertension     Past Surgical History:  Procedure Laterality Date   COLONOSCOPY  07/19/2011   Procedure: COLONOSCOPY;  Surgeon: Daneil Dolin, MD;  [facility]: [facility];  Service: Endoscopy;  Laterality: N/A;  9:00 AM; RESULTS: Mix of tubular adenoma  and hyperplastic polyps.   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 His smoking use included cigarettes. He has a 45.00 pack-year smoking history. He has never used smokeless tobacco. He reports current alcohol use. He reports that he does not use drugs.  ALLERGIES:  Allergies  Allergen Reactions   Other Hives  ketchup    ROS: Pertinent items noted in HPI and remainder of comprehensive ROS otherwise negative.  HOME MEDS: Current Outpatient Medications on File Prior to Visit  Medication Sig Dispense Refill   amLODipine (NORVASC) 5 MG tablet Take 5 mg by mouth at bedtime.      aspirin 81 MG chewable tablet Chew 1 tablet (81 mg total) by mouth 2 (two) times daily. (Patient taking differently: Chew 81 mg by mouth in the morning.) 35 tablet 0   docusate sodium (COLACE) 100 MG capsule Take 100 mg by mouth daily as needed for mild constipation or moderate constipation.     Ferrous Sulfate (IRON) 325 (65 Fe) MG TABS Take 352 mg by mouth as needed (Takes 3 days  before platelet donation.  Does this every 2 weeks.).     lisinopril-hydrochlorothiazide (PRINZIDE,ZESTORETIC) 20-25 MG per tablet Take 1 tablet by mouth daily.     pravastatin (PRAVACHOL) 20 MG tablet Take 20 mg by mouth at bedtime.      triamcinolone cream (KENALOG) 0.1 % Apply 1 application. topically daily as needed (Psoriasis).     SHINGRIX injection      No current facility-administered medications on file prior to visit.    LABS/IMAGING: No results found for this or any previous visit (from the past 48 hour(s)). No results found.  LIPID PANEL: No results found for: "CHOL", "TRIG", "HDL", "CHOLHDL", "VLDL", "LDLCALC", "LDLDIRECT"  WEIGHTS: Wt Readings from Last 3 Encounters:  12/06/21 191 lb 9.6 oz (86.9 kg)  08/08/21 190 lb (86.2 kg)  07/15/21 194 lb (88 kg)    VITALS: BP 122/78 (BP Location: Left Arm, Patient Position: Sitting, Cuff Size: Normal)   Pulse (!) 58   Resp 20   Ht '5\' 9"'$  (1.753 m)   Wt 191 lb 9.6 oz (86.9 kg)   SpO2 98%   BMI 28.29 kg/m   EXAM: General appearance: alert and no distress Neck: no carotid bruit, no JVD, and thyroid not enlarged, symmetric, no tenderness/mass/nodules Lungs: clear to auscultation bilaterally Heart: regular rate and rhythm, S1, S2 normal, no murmur, click, rub or gallop Abdomen: soft, non-tender; bowel sounds normal; no masses,  no organomegaly Extremities: extremities normal, atraumatic, no cyanosis or edema Pulses: 2+ and symmetric Skin: Skin color, texture, turgor normal. No rashes or lesions Neurologic: Grossly normal Psych: Pleasant  EKG: Sinus bradycardia with scooped ST segments- personally reviewed  ASSESSMENT: Noncardiac chest discomfort  PLAN: 1.   [name] is describing a noncardiac chest discomfort.  He denies any pain and these episodes are very infrequent, happening only twice a year and not associated with exertion or relieved by rest.  He is physically active and has an active job at [employer], climbing  ladders and has no symptoms associated with this.  EKG is not specific for any ischemia.  He does have a history of some PACs and could feasibly have an intermittent arrhythmia.  He should monitor for recurrence of the symptoms.  If they worsen or if associated with exertion and relieved by rest, then he can reach out to [place] for further testing, at this point however I would not recommend any additional testing at this time.  Thanks for the kind referral.  Follow-up as needed.  Pixie Casino, MD, [facility], [place] Director of the [facility]  [facility] Diplomate of the American Board of Clinical Lipidology Attending Cardiologist  Direct Dial: [phone]  Fax: [phone]  Website:  [email or web address]  Jonn Chaikin Hatim Homann 12/06/2021, 2:51 PM

## 2021-12-06 NOTE — Patient Instructions (Signed)
Medication Instructions:  Your physician recommends that you continue on your current medications as directed. Please refer to the Current Medication list given to you today.   Labwork: None  Testing/Procedures: None  Follow-Up: Follow up with Dr. Debara Pickett as needed.   Any Other Special Instructions Will Be Listed Below (If Applicable).     If you need a refill on your cardiac medications before your next appointment, please call your pharmacy.

## 2021-12-28 DIAGNOSIS — Z1283 Encounter for screening for malignant neoplasm of skin: Secondary | ICD-10-CM | POA: Diagnosis not present

## 2021-12-28 DIAGNOSIS — B351 Tinea unguium: Secondary | ICD-10-CM | POA: Diagnosis not present

## 2021-12-28 DIAGNOSIS — D485 Neoplasm of uncertain behavior of skin: Secondary | ICD-10-CM | POA: Diagnosis not present

## 2021-12-28 DIAGNOSIS — D225 Melanocytic nevi of trunk: Secondary | ICD-10-CM | POA: Diagnosis not present

## 2022-01-04 DIAGNOSIS — D3131 Benign neoplasm of right choroid: Secondary | ICD-10-CM | POA: Diagnosis not present

## 2022-01-04 DIAGNOSIS — H43823 Vitreomacular adhesion, bilateral: Secondary | ICD-10-CM | POA: Diagnosis not present

## 2022-01-04 DIAGNOSIS — H2513 Age-related nuclear cataract, bilateral: Secondary | ICD-10-CM | POA: Diagnosis not present

## 2022-01-04 DIAGNOSIS — D3132 Benign neoplasm of left choroid: Secondary | ICD-10-CM | POA: Diagnosis not present

## 2022-05-05 DIAGNOSIS — I10 Essential (primary) hypertension: Secondary | ICD-10-CM | POA: Diagnosis not present

## 2022-05-05 DIAGNOSIS — E782 Mixed hyperlipidemia: Secondary | ICD-10-CM | POA: Diagnosis not present

## 2022-05-05 DIAGNOSIS — R7301 Impaired fasting glucose: Secondary | ICD-10-CM | POA: Diagnosis not present

## 2022-05-11 DIAGNOSIS — K149 Disease of tongue, unspecified: Secondary | ICD-10-CM | POA: Diagnosis not present

## 2022-05-11 DIAGNOSIS — Z96641 Presence of right artificial hip joint: Secondary | ICD-10-CM | POA: Diagnosis not present

## 2022-05-11 DIAGNOSIS — R7303 Prediabetes: Secondary | ICD-10-CM | POA: Diagnosis not present

## 2022-05-11 DIAGNOSIS — Z7689 Persons encountering health services in other specified circumstances: Secondary | ICD-10-CM | POA: Diagnosis not present

## 2022-05-11 DIAGNOSIS — R0789 Other chest pain: Secondary | ICD-10-CM | POA: Diagnosis not present

## 2022-05-11 DIAGNOSIS — E782 Mixed hyperlipidemia: Secondary | ICD-10-CM | POA: Diagnosis not present

## 2022-05-11 DIAGNOSIS — Z0000 Encounter for general adult medical examination without abnormal findings: Secondary | ICD-10-CM | POA: Diagnosis not present

## 2022-05-11 DIAGNOSIS — L409 Psoriasis, unspecified: Secondary | ICD-10-CM | POA: Diagnosis not present

## 2022-05-11 DIAGNOSIS — I10 Essential (primary) hypertension: Secondary | ICD-10-CM | POA: Diagnosis not present

## 2022-05-26 ENCOUNTER — Other Ambulatory Visit (HOSPITAL_COMMUNITY): Payer: Self-pay | Admitting: Family Medicine

## 2022-05-26 DIAGNOSIS — F17200 Nicotine dependence, unspecified, uncomplicated: Secondary | ICD-10-CM

## 2022-05-26 DIAGNOSIS — Z7689 Persons encountering health services in other specified circumstances: Secondary | ICD-10-CM

## 2022-07-02 IMAGING — CT CT CHEST LUNG CANCER SCREENING LOW DOSE W/O CM
2 of 3 series · 15 of 36 positions shown, 18 images · non-contrast
Comparison: 07/19/2015

CLINICAL DATA: Forty-five pack-year smoking history. Quit 8 years
ago.

EXAM:
CT CHEST WITHOUT CONTRAST LOW-DOSE FOR LUNG CANCER SCREENING
TECHNIQUE: Multidetector CT imaging of the chest was performed following the
standard protocol without IV contrast.

[Series 2: axial st · axial · 0.73mm/px · z∈[+1130,+1400]mm · 12 of 64 slices shown, 15 images]
[im 5/64  mediastinal]
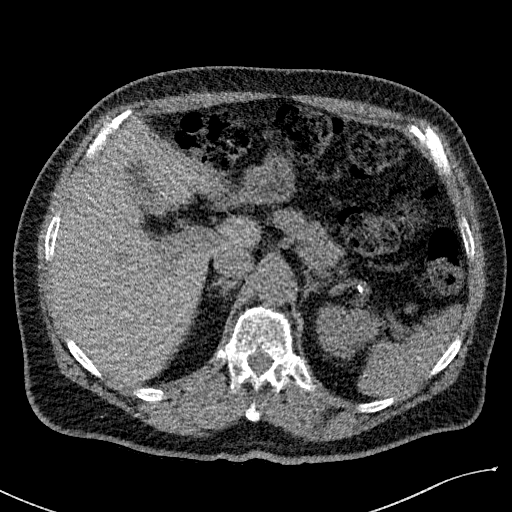
[im 5/64  lung]
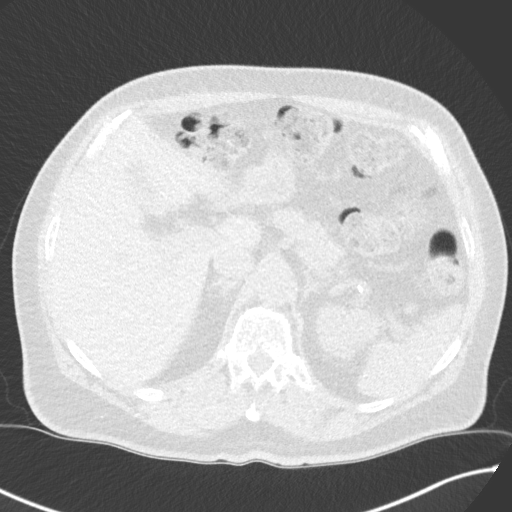
[im 10/64  lung]
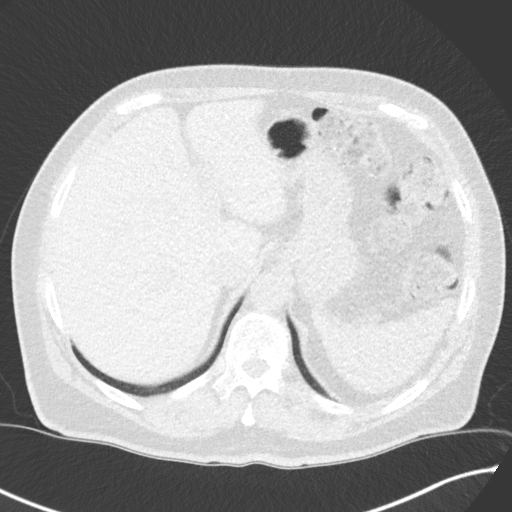
[im 15/64  lung]
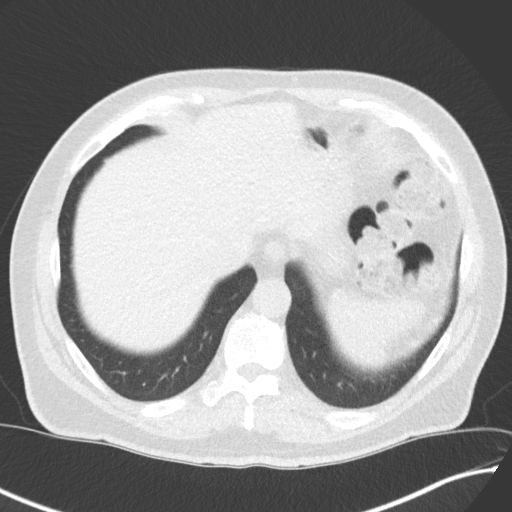
[im 19/64  lung]
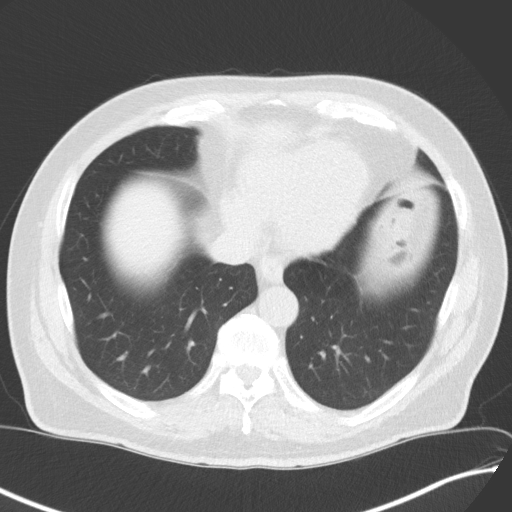
[im 24/64  mediastinal]
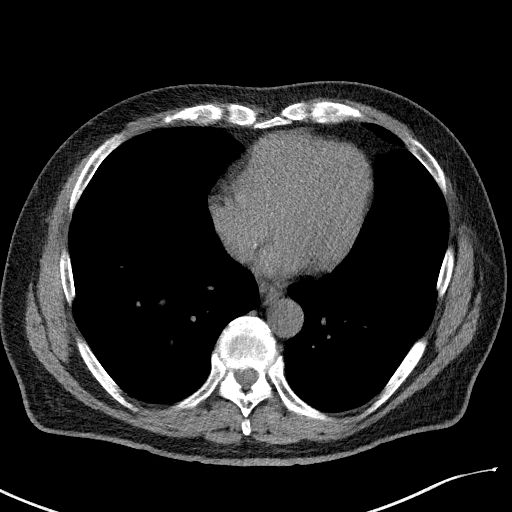
[im 24/64  lung]
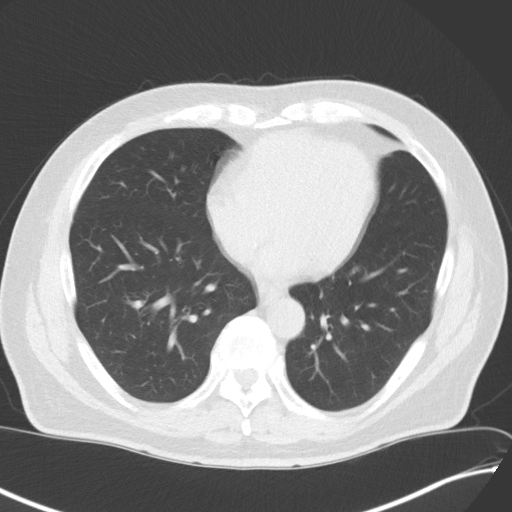
[im 29/64  lung]
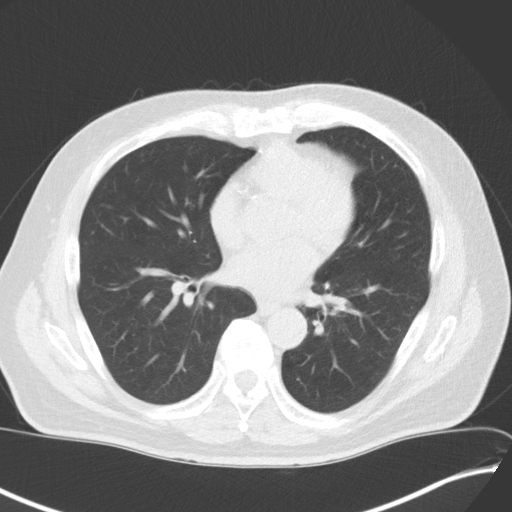
[im 36/64  lung]
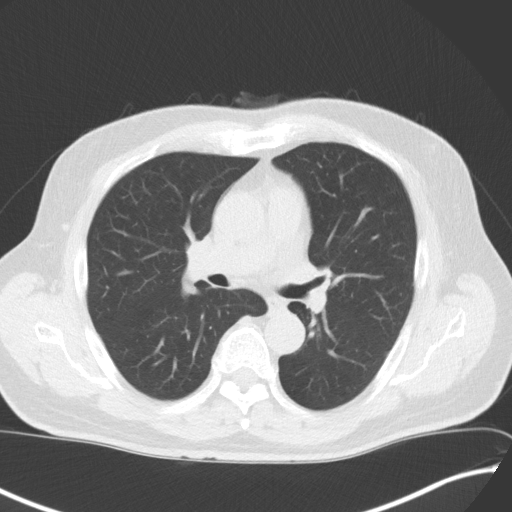
[im 40/64  lung]
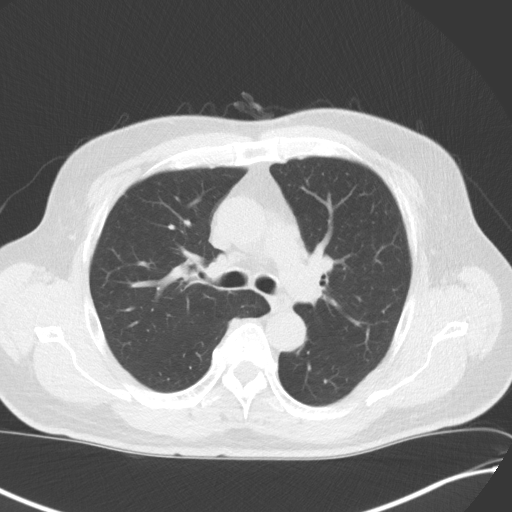
[im 45/64  mediastinal]
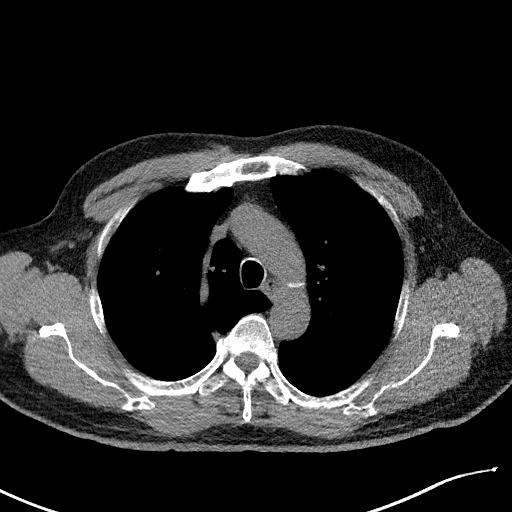
[im 45/64  lung]
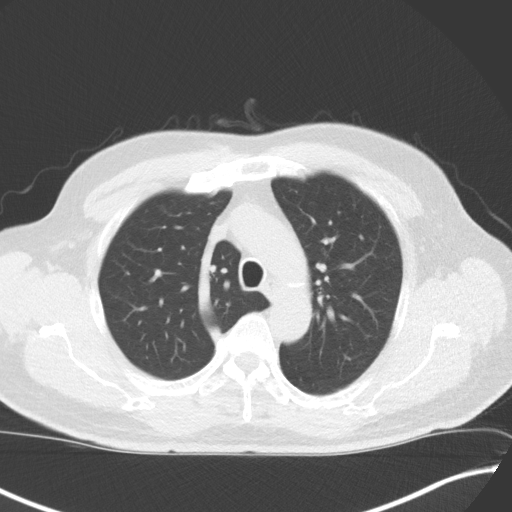
[im 50/64  lung]
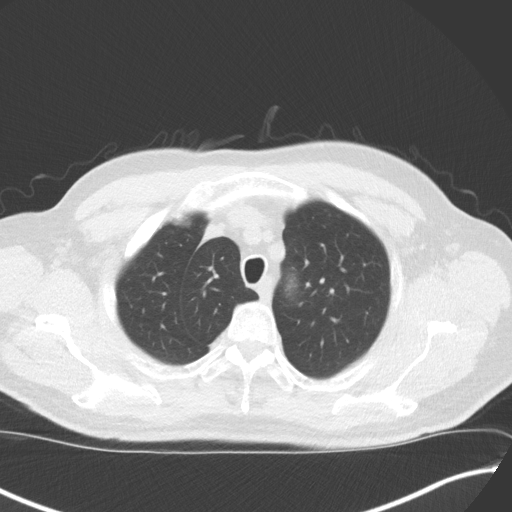
[im 54/64  lung]
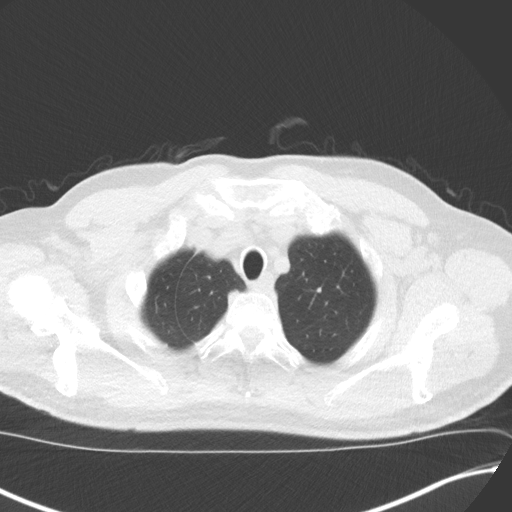
[im 59/64  lung]
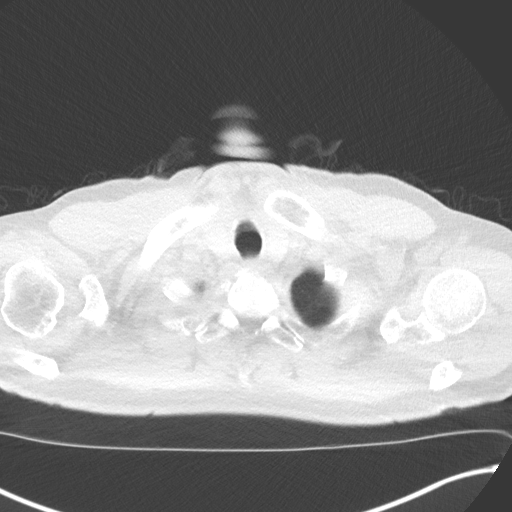

[Series 5: coronal · coronal · 0.65mm/px · 3 of 300 slices shown]
[im 60/300  lung]
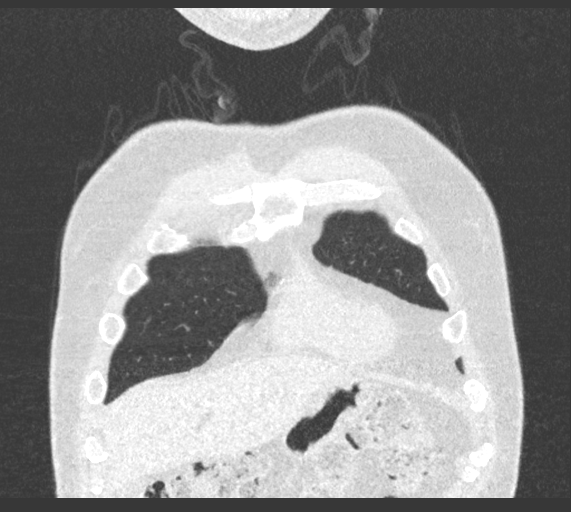
[im 120/300  lung]
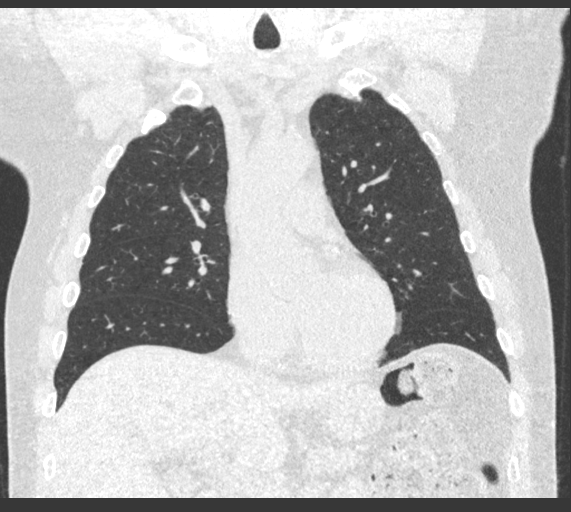
[im 180/300  lung]
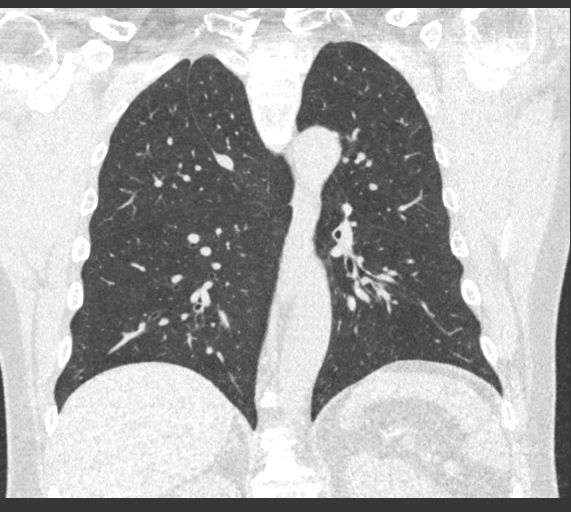

[15 of 36 positions shown; findings below may reference images not displayed]

FINDINGS: Cardiovascular: Aortic atherosclerosis. Normal heart size, without
pericardial effusion. Multivessel coronary artery atherosclerosis.

Mediastinum/Nodes: No mediastinal or definite hilar adenopathy,
given limitations of unenhanced CT.

Lungs/Pleura: Azygos fissure. No pleural fluid. Mild centrilobular
emphysema. Right-sided pulmonary nodules of maximally volume derived
equivalent diameter 3.2 mm.

Upper Abdomen: Normal imaged portions of the liver, spleen, stomach,
pancreas, gallbladder, adrenal glands, kidneys.

Musculoskeletal: No acute osseous abnormality.
IMPRESSION: 1. Lung-RADS 2, benign appearance or behavior. Continue annual
screening with low-dose chest CT without contrast in 12 months.
2. Aortic Atherosclerosis ([id number]-[id number].H) and Emphysema ([id number].5).
Coronary artery atherosclerosis.

## 2022-08-01 DIAGNOSIS — F5221 Male erectile disorder: Secondary | ICD-10-CM | POA: Diagnosis not present

## 2022-08-01 DIAGNOSIS — N529 Male erectile dysfunction, unspecified: Secondary | ICD-10-CM | POA: Diagnosis not present

## 2022-08-02 DIAGNOSIS — H2513 Age-related nuclear cataract, bilateral: Secondary | ICD-10-CM | POA: Diagnosis not present

## 2022-08-02 DIAGNOSIS — D3132 Benign neoplasm of left choroid: Secondary | ICD-10-CM | POA: Diagnosis not present

## 2022-08-02 DIAGNOSIS — D3131 Benign neoplasm of right choroid: Secondary | ICD-10-CM | POA: Diagnosis not present

## 2022-08-02 DIAGNOSIS — H43823 Vitreomacular adhesion, bilateral: Secondary | ICD-10-CM | POA: Diagnosis not present

## 2022-08-09 ENCOUNTER — Ambulatory Visit (HOSPITAL_COMMUNITY)
Admission: RE | Admit: 2022-08-09 | Discharge: 2022-08-09 | Disposition: A | Discharge status: Home / Self Care | Payer: Medicare HMO | Source: Ambulatory Visit | Attending: Family Medicine | Admitting: Family Medicine

## 2022-08-09 DIAGNOSIS — Z122 Encounter for screening for malignant neoplasm of respiratory organs: Secondary | ICD-10-CM | POA: Insufficient documentation

## 2022-08-09 DIAGNOSIS — J439 Emphysema, unspecified: Secondary | ICD-10-CM | POA: Insufficient documentation

## 2022-08-09 DIAGNOSIS — Z87891 Personal history of nicotine dependence: Secondary | ICD-10-CM | POA: Diagnosis not present

## 2022-08-09 DIAGNOSIS — I700 Atherosclerosis of aorta: Secondary | ICD-10-CM | POA: Diagnosis not present

## 2022-08-09 DIAGNOSIS — Z7689 Persons encountering health services in other specified circumstances: Secondary | ICD-10-CM | POA: Diagnosis not present

## 2022-08-09 DIAGNOSIS — F17200 Nicotine dependence, unspecified, uncomplicated: Secondary | ICD-10-CM | POA: Insufficient documentation

## 2022-11-06 DIAGNOSIS — R7301 Impaired fasting glucose: Secondary | ICD-10-CM | POA: Diagnosis not present

## 2022-11-06 DIAGNOSIS — E782 Mixed hyperlipidemia: Secondary | ICD-10-CM | POA: Diagnosis not present

## 2022-11-06 DIAGNOSIS — I10 Essential (primary) hypertension: Secondary | ICD-10-CM | POA: Diagnosis not present

## 2022-11-06 DIAGNOSIS — Z125 Encounter for screening for malignant neoplasm of prostate: Secondary | ICD-10-CM | POA: Diagnosis not present

## 2022-11-10 DIAGNOSIS — L409 Psoriasis, unspecified: Secondary | ICD-10-CM | POA: Diagnosis not present

## 2022-11-10 DIAGNOSIS — K149 Disease of tongue, unspecified: Secondary | ICD-10-CM | POA: Diagnosis not present

## 2022-11-10 DIAGNOSIS — I10 Essential (primary) hypertension: Secondary | ICD-10-CM | POA: Diagnosis not present

## 2022-11-10 DIAGNOSIS — L723 Sebaceous cyst: Secondary | ICD-10-CM | POA: Diagnosis not present

## 2022-11-10 DIAGNOSIS — R432 Parageusia: Secondary | ICD-10-CM | POA: Diagnosis not present

## 2022-11-10 DIAGNOSIS — Z125 Encounter for screening for malignant neoplasm of prostate: Secondary | ICD-10-CM | POA: Diagnosis not present

## 2022-11-10 DIAGNOSIS — E782 Mixed hyperlipidemia: Secondary | ICD-10-CM | POA: Diagnosis not present

## 2022-11-10 DIAGNOSIS — R0789 Other chest pain: Secondary | ICD-10-CM | POA: Diagnosis not present

## 2022-11-10 DIAGNOSIS — R7303 Prediabetes: Secondary | ICD-10-CM | POA: Diagnosis not present

## 2023-01-10 DIAGNOSIS — H524 Presbyopia: Secondary | ICD-10-CM | POA: Diagnosis not present

## 2023-05-04 DIAGNOSIS — E782 Mixed hyperlipidemia: Secondary | ICD-10-CM | POA: Diagnosis not present

## 2023-05-04 DIAGNOSIS — I10 Essential (primary) hypertension: Secondary | ICD-10-CM | POA: Diagnosis not present

## 2023-05-04 DIAGNOSIS — R7301 Impaired fasting glucose: Secondary | ICD-10-CM | POA: Diagnosis not present

## 2023-05-14 DIAGNOSIS — R0789 Other chest pain: Secondary | ICD-10-CM | POA: Diagnosis not present

## 2023-05-14 DIAGNOSIS — I10 Essential (primary) hypertension: Secondary | ICD-10-CM | POA: Diagnosis not present

## 2023-05-14 DIAGNOSIS — L723 Sebaceous cyst: Secondary | ICD-10-CM | POA: Diagnosis not present

## 2023-05-14 DIAGNOSIS — J449 Chronic obstructive pulmonary disease, unspecified: Secondary | ICD-10-CM | POA: Diagnosis not present

## 2023-05-14 DIAGNOSIS — E782 Mixed hyperlipidemia: Secondary | ICD-10-CM | POA: Diagnosis not present

## 2023-05-14 DIAGNOSIS — Z0000 Encounter for general adult medical examination without abnormal findings: Secondary | ICD-10-CM | POA: Diagnosis not present

## 2023-05-14 DIAGNOSIS — K149 Disease of tongue, unspecified: Secondary | ICD-10-CM | POA: Diagnosis not present

## 2023-05-14 DIAGNOSIS — R7303 Prediabetes: Secondary | ICD-10-CM | POA: Diagnosis not present

## 2023-05-14 DIAGNOSIS — I700 Atherosclerosis of aorta: Secondary | ICD-10-CM | POA: Diagnosis not present

## 2023-07-16 ENCOUNTER — Other Ambulatory Visit (HOSPITAL_COMMUNITY): Payer: Self-pay | Admitting: Nurse Practitioner

## 2023-07-16 DIAGNOSIS — Z7689 Persons encountering health services in other specified circumstances: Secondary | ICD-10-CM

## 2023-07-17 DIAGNOSIS — H524 Presbyopia: Secondary | ICD-10-CM | POA: Diagnosis not present

## 2023-07-19 ENCOUNTER — Ambulatory Visit (HOSPITAL_COMMUNITY)
Admission: RE | Admit: 2023-07-19 | Discharge: 2023-07-19 | Disposition: A | Discharge status: Home / Self Care | Source: Ambulatory Visit | Attending: Nurse Practitioner | Admitting: Nurse Practitioner

## 2023-07-19 DIAGNOSIS — Z7689 Persons encountering health services in other specified circumstances: Secondary | ICD-10-CM | POA: Diagnosis not present

## 2023-07-19 DIAGNOSIS — I700 Atherosclerosis of aorta: Secondary | ICD-10-CM | POA: Insufficient documentation

## 2023-07-19 DIAGNOSIS — I2510 Atherosclerotic heart disease of native coronary artery without angina pectoris: Secondary | ICD-10-CM | POA: Diagnosis not present

## 2023-07-19 DIAGNOSIS — F17210 Nicotine dependence, cigarettes, uncomplicated: Secondary | ICD-10-CM | POA: Insufficient documentation

## 2023-07-19 DIAGNOSIS — Z122 Encounter for screening for malignant neoplasm of respiratory organs: Secondary | ICD-10-CM | POA: Diagnosis not present

## 2023-07-19 DIAGNOSIS — J439 Emphysema, unspecified: Secondary | ICD-10-CM | POA: Insufficient documentation

## 2023-08-08 DIAGNOSIS — D3132 Benign neoplasm of left choroid: Secondary | ICD-10-CM | POA: Diagnosis not present

## 2023-08-08 DIAGNOSIS — D3131 Benign neoplasm of right choroid: Secondary | ICD-10-CM | POA: Diagnosis not present

## 2023-08-08 DIAGNOSIS — H2513 Age-related nuclear cataract, bilateral: Secondary | ICD-10-CM | POA: Diagnosis not present

## 2023-08-08 DIAGNOSIS — H43823 Vitreomacular adhesion, bilateral: Secondary | ICD-10-CM | POA: Diagnosis not present

## 2023-08-29 DIAGNOSIS — D2272 Melanocytic nevi of left lower limb, including hip: Secondary | ICD-10-CM | POA: Diagnosis not present

## 2023-08-29 DIAGNOSIS — D485 Neoplasm of uncertain behavior of skin: Secondary | ICD-10-CM | POA: Diagnosis not present

## 2023-08-29 DIAGNOSIS — L400 Psoriasis vulgaris: Secondary | ICD-10-CM | POA: Diagnosis not present

## 2023-08-29 DIAGNOSIS — B351 Tinea unguium: Secondary | ICD-10-CM | POA: Diagnosis not present

## 2023-08-29 DIAGNOSIS — Z1283 Encounter for screening for malignant neoplasm of skin: Secondary | ICD-10-CM | POA: Diagnosis not present

## 2023-08-29 DIAGNOSIS — L570 Actinic keratosis: Secondary | ICD-10-CM | POA: Diagnosis not present

## 2023-08-29 DIAGNOSIS — X32XXXD Exposure to sunlight, subsequent encounter: Secondary | ICD-10-CM | POA: Diagnosis not present

## 2023-09-11 ENCOUNTER — Other Ambulatory Visit (HOSPITAL_COMMUNITY): Payer: Self-pay | Admitting: Radiology

## 2023-09-11 DIAGNOSIS — J439 Emphysema, unspecified: Secondary | ICD-10-CM

## 2023-10-01 ENCOUNTER — Ambulatory Visit (HOSPITAL_COMMUNITY)
Admission: RE | Admit: 2023-10-01 | Discharge: 2023-10-01 | Disposition: A | Discharge status: Home / Self Care | Source: Ambulatory Visit | Attending: Internal Medicine | Admitting: Internal Medicine

## 2023-10-01 DIAGNOSIS — J439 Emphysema, unspecified: Secondary | ICD-10-CM | POA: Diagnosis not present

## 2023-10-01 LAB — PULMONARY FUNCTION TEST
DL/VA % pred: 112 %
DL/VA: 4.52 ml/min/mmHg/L
DLCO unc % pred: 87 %
DLCO unc: 21.72 ml/min/mmHg
FEF 25-75 Post: 2.67 L/s
FEF 25-75 Pre: 1.55 L/s
FEF2575-%Change-Post: 72 %
FEF2575-%Pred-Post: 114 %
FEF2575-%Pred-Pre: 66 %
FEV1-%Change-Post: 13 %
FEV1-%Pred-Post: 86 %
FEV1-%Pred-Pre: 76 %
FEV1-Post: 2.68 L
FEV1-Pre: 2.36 L
FEV1FVC-%Change-Post: 4 %
FEV1FVC-%Pred-Pre: 97 %
FEV6-%Change-Post: 7 %
FEV6-%Pred-Post: 86 %
FEV6-%Pred-Pre: 80 %
FEV6-Post: 3.46 L
FEV6-Pre: 3.2 L
FEV6FVC-%Change-Post: 0 %
FEV6FVC-%Pred-Post: 103 %
FEV6FVC-%Pred-Pre: 103 %
FVC-%Change-Post: 8 %
FVC-%Pred-Post: 84 %
FVC-%Pred-Pre: 77 %
FVC-Post: 3.56 L
FVC-Pre: 3.29 L
Post FEV1/FVC ratio: 75 %
Post FEV6/FVC ratio: 97 %
Pre FEV1/FVC ratio: 72 %
Pre FEV6/FVC Ratio: 97 %
RV % pred: 107 %
RV: 2.6 L
TLC % pred: 93 %
TLC: 6.36 L

## 2023-10-01 MED ORDER — ALBUTEROL SULFATE (2.5 MG/3ML) 0.083% IN NEBU
2.5000 mg | INHALATION_SOLUTION | Freq: Once | RESPIRATORY_TRACT | Status: AC
  Administered 2023-10-01: 2.5 mg via RESPIRATORY_TRACT

## 2023-11-06 DIAGNOSIS — Z125 Encounter for screening for malignant neoplasm of prostate: Secondary | ICD-10-CM | POA: Diagnosis not present

## 2023-11-06 DIAGNOSIS — E782 Mixed hyperlipidemia: Secondary | ICD-10-CM | POA: Diagnosis not present

## 2023-11-06 DIAGNOSIS — R7303 Prediabetes: Secondary | ICD-10-CM | POA: Diagnosis not present

## 2023-11-12 DIAGNOSIS — L723 Sebaceous cyst: Secondary | ICD-10-CM | POA: Diagnosis not present

## 2023-11-12 DIAGNOSIS — J439 Emphysema, unspecified: Secondary | ICD-10-CM | POA: Diagnosis not present

## 2023-11-12 DIAGNOSIS — E782 Mixed hyperlipidemia: Secondary | ICD-10-CM | POA: Diagnosis not present

## 2023-11-12 DIAGNOSIS — K149 Disease of tongue, unspecified: Secondary | ICD-10-CM | POA: Diagnosis not present

## 2023-11-12 DIAGNOSIS — R0789 Other chest pain: Secondary | ICD-10-CM | POA: Diagnosis not present

## 2023-11-12 DIAGNOSIS — Z7689 Persons encountering health services in other specified circumstances: Secondary | ICD-10-CM | POA: Diagnosis not present

## 2023-11-12 DIAGNOSIS — I700 Atherosclerosis of aorta: Secondary | ICD-10-CM | POA: Diagnosis not present

## 2023-11-12 DIAGNOSIS — R7303 Prediabetes: Secondary | ICD-10-CM | POA: Diagnosis not present

## 2023-11-12 DIAGNOSIS — I10 Essential (primary) hypertension: Secondary | ICD-10-CM | POA: Diagnosis not present

## 2024-03-14 DIAGNOSIS — D3132 Benign neoplasm of left choroid: Secondary | ICD-10-CM | POA: Diagnosis not present

## 2024-03-14 DIAGNOSIS — H43823 Vitreomacular adhesion, bilateral: Secondary | ICD-10-CM | POA: Diagnosis not present

## 2024-03-14 DIAGNOSIS — H2513 Age-related nuclear cataract, bilateral: Secondary | ICD-10-CM | POA: Diagnosis not present

## 2024-03-14 DIAGNOSIS — D3131 Benign neoplasm of right choroid: Secondary | ICD-10-CM | POA: Diagnosis not present

## 2024-05-21 ENCOUNTER — Other Ambulatory Visit (HOSPITAL_COMMUNITY): Payer: Self-pay | Admitting: Nurse Practitioner

## 2024-05-21 DIAGNOSIS — Z122 Encounter for screening for malignant neoplasm of respiratory organs: Secondary | ICD-10-CM

## 2024-08-18 ENCOUNTER — Ambulatory Visit (HOSPITAL_COMMUNITY)
